# Patient Record
Sex: Male | Born: 1970 | Race: Black or African American | Hispanic: No | Marital: Married | Smoking: Former smoker
Health system: Southern US, Community
[De-identification: ages and names within clinical notes are randomized; demographics above are authoritative.]

## PROBLEM LIST (undated history)

## (undated) DIAGNOSIS — E119 Type 2 diabetes mellitus without complications: Secondary | ICD-10-CM

## (undated) DIAGNOSIS — K859 Acute pancreatitis without necrosis or infection, unspecified: Secondary | ICD-10-CM

## (undated) DIAGNOSIS — I10 Essential (primary) hypertension: Secondary | ICD-10-CM

## (undated) HISTORY — PX: ADRENALECTOMY: SHX876

## (undated) HISTORY — PX: HERNIA REPAIR: SHX51

---

## 2002-10-17 ENCOUNTER — Emergency Department (HOSPITAL_COMMUNITY): Admission: EM | Admit: 2002-10-17 | Discharge: 2002-10-17 | Payer: Self-pay | Admitting: Emergency Medicine

## 2003-02-28 ENCOUNTER — Encounter: Payer: Self-pay | Admitting: Emergency Medicine

## 2003-02-28 ENCOUNTER — Emergency Department (HOSPITAL_COMMUNITY): Admission: AD | Admit: 2003-02-28 | Discharge: 2003-02-28 | Payer: Self-pay | Admitting: Emergency Medicine

## 2003-07-15 ENCOUNTER — Emergency Department (HOSPITAL_COMMUNITY): Admission: AD | Admit: 2003-07-15 | Discharge: 2003-07-15 | Payer: Self-pay | Admitting: Family Medicine

## 2003-09-04 ENCOUNTER — Emergency Department (HOSPITAL_COMMUNITY): Admission: EM | Admit: 2003-09-04 | Discharge: 2003-09-04 | Payer: Self-pay | Admitting: Family Medicine

## 2004-05-23 ENCOUNTER — Emergency Department (HOSPITAL_COMMUNITY): Admission: EM | Admit: 2004-05-23 | Discharge: 2004-05-23 | Payer: Self-pay | Admitting: Family Medicine

## 2004-12-09 ENCOUNTER — Ambulatory Visit (HOSPITAL_COMMUNITY): Admission: EM | Admit: 2004-12-09 | Discharge: 2004-12-09 | Payer: Self-pay | Admitting: Emergency Medicine

## 2005-03-07 ENCOUNTER — Emergency Department (HOSPITAL_COMMUNITY): Admission: EM | Admit: 2005-03-07 | Discharge: 2005-03-07 | Payer: Self-pay | Admitting: Emergency Medicine

## 2005-04-26 ENCOUNTER — Emergency Department (HOSPITAL_COMMUNITY): Admission: EM | Admit: 2005-04-26 | Discharge: 2005-04-26 | Payer: Self-pay | Admitting: Emergency Medicine

## 2005-05-15 ENCOUNTER — Emergency Department (HOSPITAL_COMMUNITY): Admission: EM | Admit: 2005-05-15 | Discharge: 2005-05-15 | Payer: Self-pay | Admitting: Emergency Medicine

## 2005-05-15 ENCOUNTER — Emergency Department (HOSPITAL_COMMUNITY): Admission: EM | Admit: 2005-05-15 | Discharge: 2005-05-15 | Payer: Self-pay | Admitting: Family Medicine

## 2016-01-16 ENCOUNTER — Encounter (HOSPITAL_BASED_OUTPATIENT_CLINIC_OR_DEPARTMENT_OTHER): Payer: Self-pay | Admitting: Emergency Medicine

## 2016-01-16 ENCOUNTER — Emergency Department (HOSPITAL_BASED_OUTPATIENT_CLINIC_OR_DEPARTMENT_OTHER)
Admission: EM | Admit: 2016-01-16 | Discharge: 2016-01-16 | Disposition: A | Discharge status: Home / Self Care | Payer: Self-pay | Attending: Emergency Medicine | Admitting: Emergency Medicine

## 2016-01-16 DIAGNOSIS — E119 Type 2 diabetes mellitus without complications: Secondary | ICD-10-CM | POA: Insufficient documentation

## 2016-01-16 DIAGNOSIS — Y929 Unspecified place or not applicable: Secondary | ICD-10-CM | POA: Insufficient documentation

## 2016-01-16 DIAGNOSIS — X500XXA Overexertion from strenuous movement or load, initial encounter: Secondary | ICD-10-CM | POA: Insufficient documentation

## 2016-01-16 DIAGNOSIS — Z7984 Long term (current) use of oral hypoglycemic drugs: Secondary | ICD-10-CM | POA: Insufficient documentation

## 2016-01-16 DIAGNOSIS — M545 Low back pain: Secondary | ICD-10-CM | POA: Insufficient documentation

## 2016-01-16 DIAGNOSIS — Y93F2 Activity, caregiving, lifting: Secondary | ICD-10-CM | POA: Insufficient documentation

## 2016-01-16 DIAGNOSIS — Y990 Civilian activity done for income or pay: Secondary | ICD-10-CM | POA: Insufficient documentation

## 2016-01-16 DIAGNOSIS — M549 Dorsalgia, unspecified: Secondary | ICD-10-CM

## 2016-01-16 HISTORY — DX: Type 2 diabetes mellitus without complications: E11.9

## 2016-01-16 MED ORDER — HYDROCODONE-ACETAMINOPHEN 5-325 MG PO TABS: 1.0000 | ORAL_TABLET | Freq: Four times a day (QID) | ORAL | Status: DC | PRN

## 2016-01-16 NOTE — ED Provider Notes (Addendum)
CSN: [id number]     Arrival date & time 01/16/16  0303 History   First MD Initiated Contact with Patient 01/16/16 [phone]     Chief Complaint  Patient presents with  . Back Injury     (Consider location/radiation/quality/duration/timing/severity/associated sxs/prior Treatment) HPI  This is a 45 year old male who was lifting a tub at work yesterday and injured his back. Specifically he is injured in the right posterolateral rib cage. He did not feel a pop or anything that suggested a broken rib. The onset of pain was gradual. It is now moderate and worse with twisting. He is not having difficulty breathing. He has taken Aleve without adequate relief.  Past Medical History  Diagnosis Date  . Diabetes mellitus without complication (HCC)    History reviewed. No pertinent past surgical history. History reviewed. No pertinent family history. Social History  Substance Use Topics  . Smoking status: Never Smoker   . Smokeless tobacco: None  . Alcohol Use: No    Review of Systems  All other systems reviewed and are negative.   Allergies  Penicillins  Home Medications   Prior to Admission medications   Medication Sig Start Date End Date Taking? Authorizing Provider  metFORMIN (GLUCOPHAGE) 1000 MG tablet Take 3 mg by mouth 2 (two) times daily with a meal.   Yes Historical Provider, MD   BP 140/106 mmHg  Pulse 78  Temp(Src) 97.9 F (36.6 C) (Oral)  Resp 20  Ht 5\' 10"  (1.778 m)  Wt 195 lb (88.451 kg)  BMI 27.98 kg/m2  SpO2 100%   Physical Exam  General: Well-developed, well-nourished male in no acute distress; appearance consistent with age of record HENT: normocephalic; atraumatic Eyes: Normal appearance Neck: supple Heart: regular rate and rhythm Lungs: clear to auscultation bilaterally Abdomen: soft; nondistended Back: Mild tenderness over right posterolateral lower rib cage without point tenderness or crepitus Extremities: No deformity; full range of motion Neurologic:  Awake, alert and oriented; motor function intact in all extremities and symmetric; no facial droop Skin: Warm and dry Psychiatric: Normal mood and affect    ED Course  Procedures (including critical care time)   MDM      Paula Libra, MD 01/16/16 0421  Paula Libra, MD 01/16/16 2423

## 2016-01-16 NOTE — ED Notes (Signed)
Patient states that he lifted a box or tub yesterday and now has pain to his right lower back

## 2016-06-01 ENCOUNTER — Emergency Department (HOSPITAL_BASED_OUTPATIENT_CLINIC_OR_DEPARTMENT_OTHER)
Admission: EM | Admit: 2016-06-01 | Discharge: 2016-06-01 | Disposition: A | Discharge status: Home / Self Care | Payer: Worker's Compensation | Attending: Emergency Medicine | Admitting: Emergency Medicine

## 2016-06-01 ENCOUNTER — Encounter (HOSPITAL_BASED_OUTPATIENT_CLINIC_OR_DEPARTMENT_OTHER): Payer: Self-pay

## 2016-06-01 ENCOUNTER — Emergency Department (HOSPITAL_BASED_OUTPATIENT_CLINIC_OR_DEPARTMENT_OTHER): Payer: Worker's Compensation

## 2016-06-01 DIAGNOSIS — J189 Pneumonia, unspecified organism: Secondary | ICD-10-CM

## 2016-06-01 DIAGNOSIS — I10 Essential (primary) hypertension: Secondary | ICD-10-CM | POA: Insufficient documentation

## 2016-06-01 DIAGNOSIS — Z7984 Long term (current) use of oral hypoglycemic drugs: Secondary | ICD-10-CM | POA: Insufficient documentation

## 2016-06-01 DIAGNOSIS — E119 Type 2 diabetes mellitus without complications: Secondary | ICD-10-CM | POA: Insufficient documentation

## 2016-06-01 DIAGNOSIS — R55 Syncope and collapse: Secondary | ICD-10-CM | POA: Insufficient documentation

## 2016-06-01 DIAGNOSIS — Z79899 Other long term (current) drug therapy: Secondary | ICD-10-CM | POA: Insufficient documentation

## 2016-06-01 HISTORY — DX: Essential (primary) hypertension: I10

## 2016-06-01 LAB — CBC WITH DIFFERENTIAL/PLATELET
BASOS ABS: 0 10*3/uL (ref 0.0–0.1)
Basophils Relative: 0 %
Eosinophils Absolute: 0.1 10*3/uL (ref 0.0–0.7)
Eosinophils Relative: 2 %
HEMATOCRIT: 43.6 % (ref 39.0–52.0)
Hemoglobin: 14.3 g/dL (ref 13.0–17.0)
LYMPHS PCT: 38 %
Lymphs Abs: 2.5 10*3/uL (ref 0.7–4.0)
MCH: 27 pg (ref 26.0–34.0)
MCHC: 32.8 g/dL (ref 30.0–36.0)
MCV: 82.3 fL (ref 78.0–100.0)
MONO ABS: 0.4 10*3/uL (ref 0.1–1.0)
Monocytes Relative: 7 %
NEUTROS ABS: 3.5 10*3/uL (ref 1.7–7.7)
Neutrophils Relative %: 53 %
Platelets: 207 10*3/uL (ref 150–400)
RBC: 5.3 MIL/uL (ref 4.22–5.81)
RDW: 14.3 % (ref 11.5–15.5)
WBC: 6.6 10*3/uL (ref 4.0–10.5)

## 2016-06-01 LAB — COMPREHENSIVE METABOLIC PANEL
ALK PHOS: 77 U/L (ref 38–126)
ALT: 13 U/L — AB (ref 17–63)
AST: 14 U/L — AB (ref 15–41)
Albumin: 4 g/dL (ref 3.5–5.0)
Anion gap: 6 (ref 5–15)
BILIRUBIN TOTAL: 0.9 mg/dL (ref 0.3–1.2)
BUN: 15 mg/dL (ref 6–20)
CALCIUM: 8.5 mg/dL — AB (ref 8.9–10.3)
CO2: 24 mmol/L (ref 22–32)
CREATININE: 0.88 mg/dL (ref 0.61–1.24)
Chloride: 110 mmol/L (ref 101–111)
GFR calc Af Amer: >60 mL/min (ref >60)
Glucose, Bld: 154 mg/dL — ABNORMAL HIGH (ref 65–99)
Potassium: 3.6 mmol/L (ref 3.5–5.1)
Sodium: 140 mmol/L (ref 135–145)
TOTAL PROTEIN: 7 g/dL (ref 6.5–8.1)

## 2016-06-01 LAB — CBG MONITORING, ED: GLUCOSE-CAPILLARY: 143 mg/dL — AB (ref 65–99)

## 2016-06-01 LAB — LIPASE, BLOOD: LIPASE: 34 U/L (ref 11–51)

## 2016-06-01 MED ORDER — AZITHROMYCIN 250 MG PO TABS: 250.0000 mg | ORAL_TABLET | Freq: Every day | ORAL | 0 refills | Status: DC

## 2016-06-01 MED ORDER — SODIUM CHLORIDE 0.9 % IV BOLUS (SEPSIS)
1000.0000 mL | Freq: Once | INTRAVENOUS | Status: AC
  Administered 2016-06-01: 1000 mL via INTRAVENOUS

## 2016-06-01 MED ORDER — AZITHROMYCIN 250 MG PO TABS
500.0000 mg | ORAL_TABLET | Freq: Once | ORAL | Status: AC
  Administered 2016-06-01: 500 mg via ORAL
  Filled 2016-06-01: qty 2

## 2016-06-01 NOTE — ED Triage Notes (Signed)
Pt states he woke up and did not feel well, states he felt light headed at that time.  Pt also c/o bilateral upper abdominal pain.  Pt states that when he got to work the light headed feeling increased and he felt like he was going to pass out, states he leaned against the wall and sat down on the ground.  Pt does not feel he passed out.  Pt also complains that since this morning with the lightheaded feeling, he feels like he is "slurring" his words.  Pt's speech is clear, denies headache, denies chest pain, grip strength is equal, no extremity drift, no facial asymmetry, no neuro deficits noted on exam.

## 2016-06-01 NOTE — ED Notes (Signed)
Pt verbalizes understanding of d/c instructions and denies any further needs at this time. 

## 2016-06-01 NOTE — ED Notes (Signed)
Pt has workers comp paperwork with him and requires a UDS and BAT as they are calling this a post accident screening.

## 2016-06-01 NOTE — ED Provider Notes (Signed)
[facility]   CSN: [id number] Arrival date & time: 06/01/16  0227     History   Chief Complaint Chief Complaint  Patient presents with  . Near Syncope    HPI Benjamin MarshallCharles E Hallahan is a 45 y.o. male.With a past medical history of hypertension diabetes presenting today with presyncope. Patient states he woke up this morning with abdominal pain is midepigastric area. He did not think anything of it. He had nausea but no vomiting. He denies diarrhea. Subsequently when he went to work tonight he had presyncopal episode and felt like she was going to pass out. He states his abdominal pain is now gone. He does not take any medication for this throughout the day. He states he ate his normal appetite. He denies any dehydration or recent illness. No further complaints.  10 Systems reviewed and are negative for acute change except as noted in the HPI.    HPI  Past Medical History:  Diagnosis Date  . Diabetes mellitus without complication (HCC)   . Hypertension     There are no active problems to display for this patient.   History reviewed. No pertinent surgical history.     Home Medications    Prior to Admission medications   Medication Sig Start Date End Date Taking? Authorizing Provider  azithromycin (ZITHROMAX) 250 MG tablet Take 1 tablet (250 mg total) by mouth daily. 06/01/16   Tomasita CrumbleAdeleke Loni Abdon, MD  HYDROcodone-acetaminophen (NORCO) 5-325 MG tablet Take 1-2 tablets by mouth every 6 (six) hours as needed (for pain). 01/16/16   John Molpus, MD  metFORMIN (GLUCOPHAGE) 1000 MG tablet Take 3 mg by mouth 2 (two) times daily with a meal.    Historical Provider, MD    Family History No family history on file.  Social History Social History  Substance Use Topics  . Smoking status: Never Smoker  . Smokeless tobacco: Not on file  . Alcohol use No     Allergies   Penicillins   Review of Systems Review of Systems   Physical Exam Updated Vital Signs BP  (!) 144/105 (BP Location: Left Arm)   Pulse 80   Temp 98.3 F (36.8 C) (Oral)   Resp 20   Ht 5\' 10"  (1.778 m)   Wt 199 lb (90.3 kg)   SpO2 97%   BMI 28.55 kg/m   Physical Exam  Constitutional: He is oriented to person, place, and time. Vital signs are normal. He appears well-developed and well-nourished.  Non-toxic appearance. He does not appear ill. No distress.  HENT:  Head: Normocephalic and atraumatic.  Nose: Nose normal.  Mouth/Throat: Oropharynx is clear and moist. No oropharyngeal exudate.  Eyes: Conjunctivae and EOM are normal. Pupils are equal, round, and reactive to light. No scleral icterus.  Neck: Normal range of motion. Neck supple. No tracheal deviation, no edema, no erythema and normal range of motion present. No thyroid mass and no thyromegaly present.  Cardiovascular: Normal rate, regular rhythm, S1 normal, S2 normal, normal heart sounds, intact distal pulses and normal pulses.  Exam reveals no gallop and no friction rub.   No murmur heard. Pulmonary/Chest: Effort normal and breath sounds normal. No respiratory distress. He has no wheezes. He has no rhonchi. He has no rales.  Abdominal: Soft. Normal appearance and bowel sounds are normal. He exhibits no distension, no ascites and no mass. There is no hepatosplenomegaly. There is no tenderness. There is no rebound, no guarding and no CVA tenderness.  Musculoskeletal: Normal range  of motion. He exhibits no edema or tenderness.  Lymphadenopathy:    He has no cervical adenopathy.  Neurological: He is alert and oriented to person, place, and time. He has normal strength. No cranial nerve deficit or sensory deficit.  Normal strength and sensation in all extremity. Normal cerebellar testing.  Skin: Skin is warm, dry and intact. No petechiae and no rash noted. He is not diaphoretic. No erythema. No pallor.  Nursing note and vitals reviewed.    ED Treatments / Results  Labs (all labs ordered are listed, but only abnormal  results are displayed) Labs Reviewed  COMPREHENSIVE METABOLIC PANEL - Abnormal; Notable for the following:       Result Value   Glucose, Bld 154 (*)    Calcium 8.5 (*)    AST 14 (*)    ALT 13 (*)    All other components within normal limits  CBG MONITORING, ED - Abnormal; Notable for the following:    Glucose-Capillary 143 (*)    All other components within normal limits  CBC WITH DIFFERENTIAL/PLATELET  LIPASE, BLOOD    EKG  EKG Interpretation  Date/Time:  Wednesday June 01 2016 02:40:04 EST Ventricular Rate:  81 PR Interval:    QRS Duration: 88 QT Interval:  370 QTC Calculation: 430 R Axis:   38 Text Interpretation:  Sinus rhythm No significant change since last tracing Confirmed by Erroll Lunani, Gurtej Noyola Ayokunle [phone]([id number]) on 06/01/2016 3:18:55 AM       Radiology Dg Chest 2 View  Result Date: 06/01/2016 CLINICAL DATA:  Feeling unwell, lightheadedness and upper abdominal pain. EXAM: CHEST  2 VIEW COMPARISON:  None. FINDINGS: Cardiomediastinal silhouette is normal. No pleural effusions or focal consolidations. Bibasilar interstitial prominence. Trachea projects midline and there is no pneumothorax. Soft tissue planes and included osseous structures are non-suspicious. IMPRESSION: Bibasilar interstitial prominence could represent atelectasis though, pneumonia not excluded. Electronically Signed   By: Awilda Metroourtnay  Bloomer M.D.   On: 06/01/2016 04:09    Procedures Procedures (including critical care time)  Medications Ordered in ED Medications  azithromycin (ZITHROMAX) tablet 500 mg (not administered)  sodium chloride 0.9 % bolus 1,000 mL (1,000 mLs Intravenous New Bag/Given 06/01/16 0407)     Initial Impression / Assessment and Plan / ED Course  I have reviewed the triage vital signs and the nursing notes.  Pertinent labs & imaging results that were available during my care of the patient were reviewed by me and considered in my medical decision making (see chart for  details).  Clinical Course     Patient presents to emergency department for presyncope. Will obtain upper studies, give IV fluids, and observed. EKG does not show any cause for possible syncope. Chest x-ray is also pending.  4:46 AM labs normal, but cxr reveals possible pna.  Could be cause of abd pain and presyncope.  Will treat with 5 days azitho.  First dose given in th ED.  Final Clinical Impressions(s) / ED Diagnoses   Final diagnoses:  Near syncope  Community acquired pneumonia, unspecified laterality    New Prescriptions New Prescriptions   AZITHROMYCIN (ZITHROMAX) 250 MG TABLET    Take 1 tablet (250 mg total) by mouth daily.     Tomasita CrumbleAdeleke Ralph Brouwer, MD 06/01/16 [phone]

## 2017-12-30 IMAGING — DX DG CHEST 2V
2 series · 2 of 2 positions shown · non-contrast
Comparison: None.

CLINICAL DATA: Feeling unwell, lightheadedness and upper abdominal
pain.

EXAM:
CHEST  2 VIEW

[chest pa]
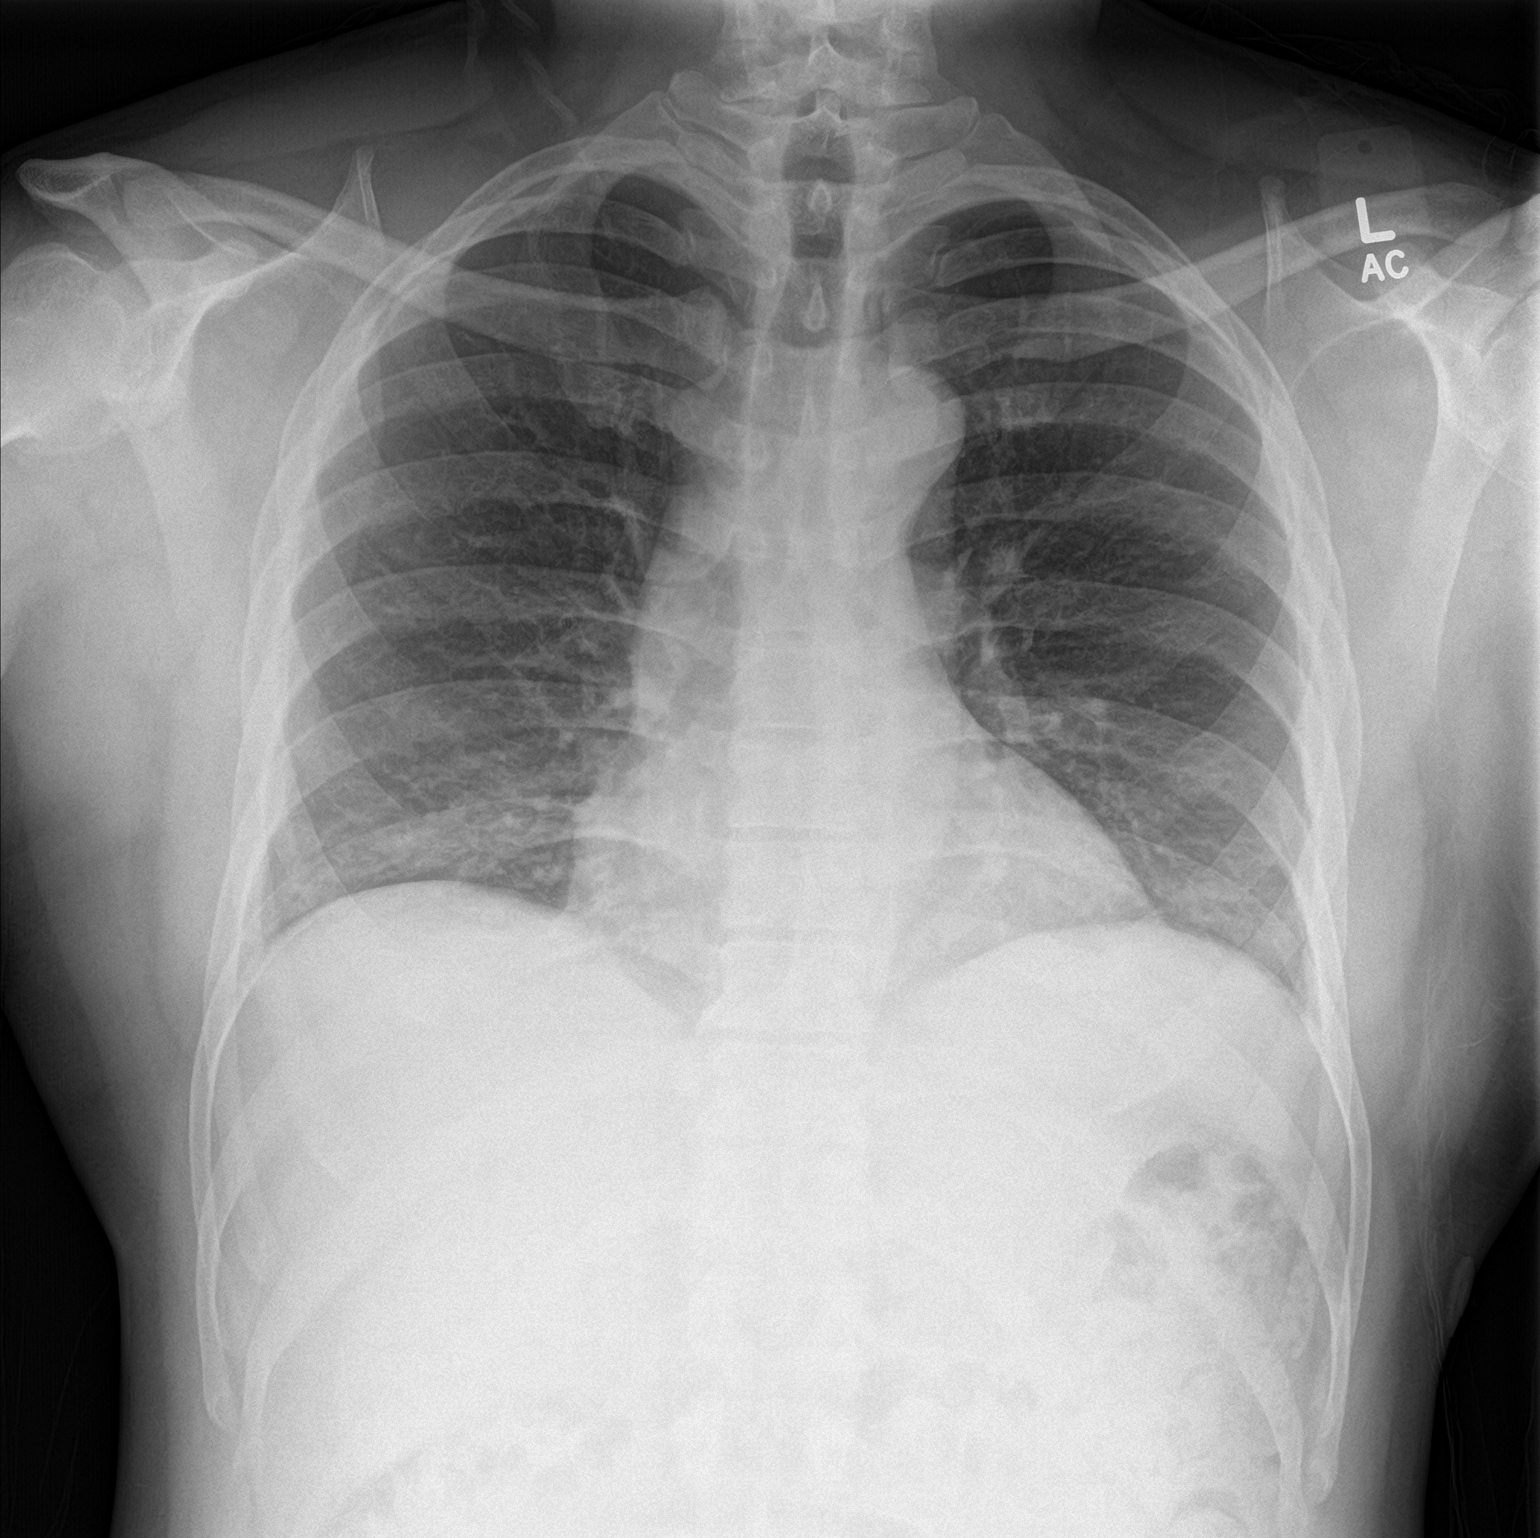

[chest lat]
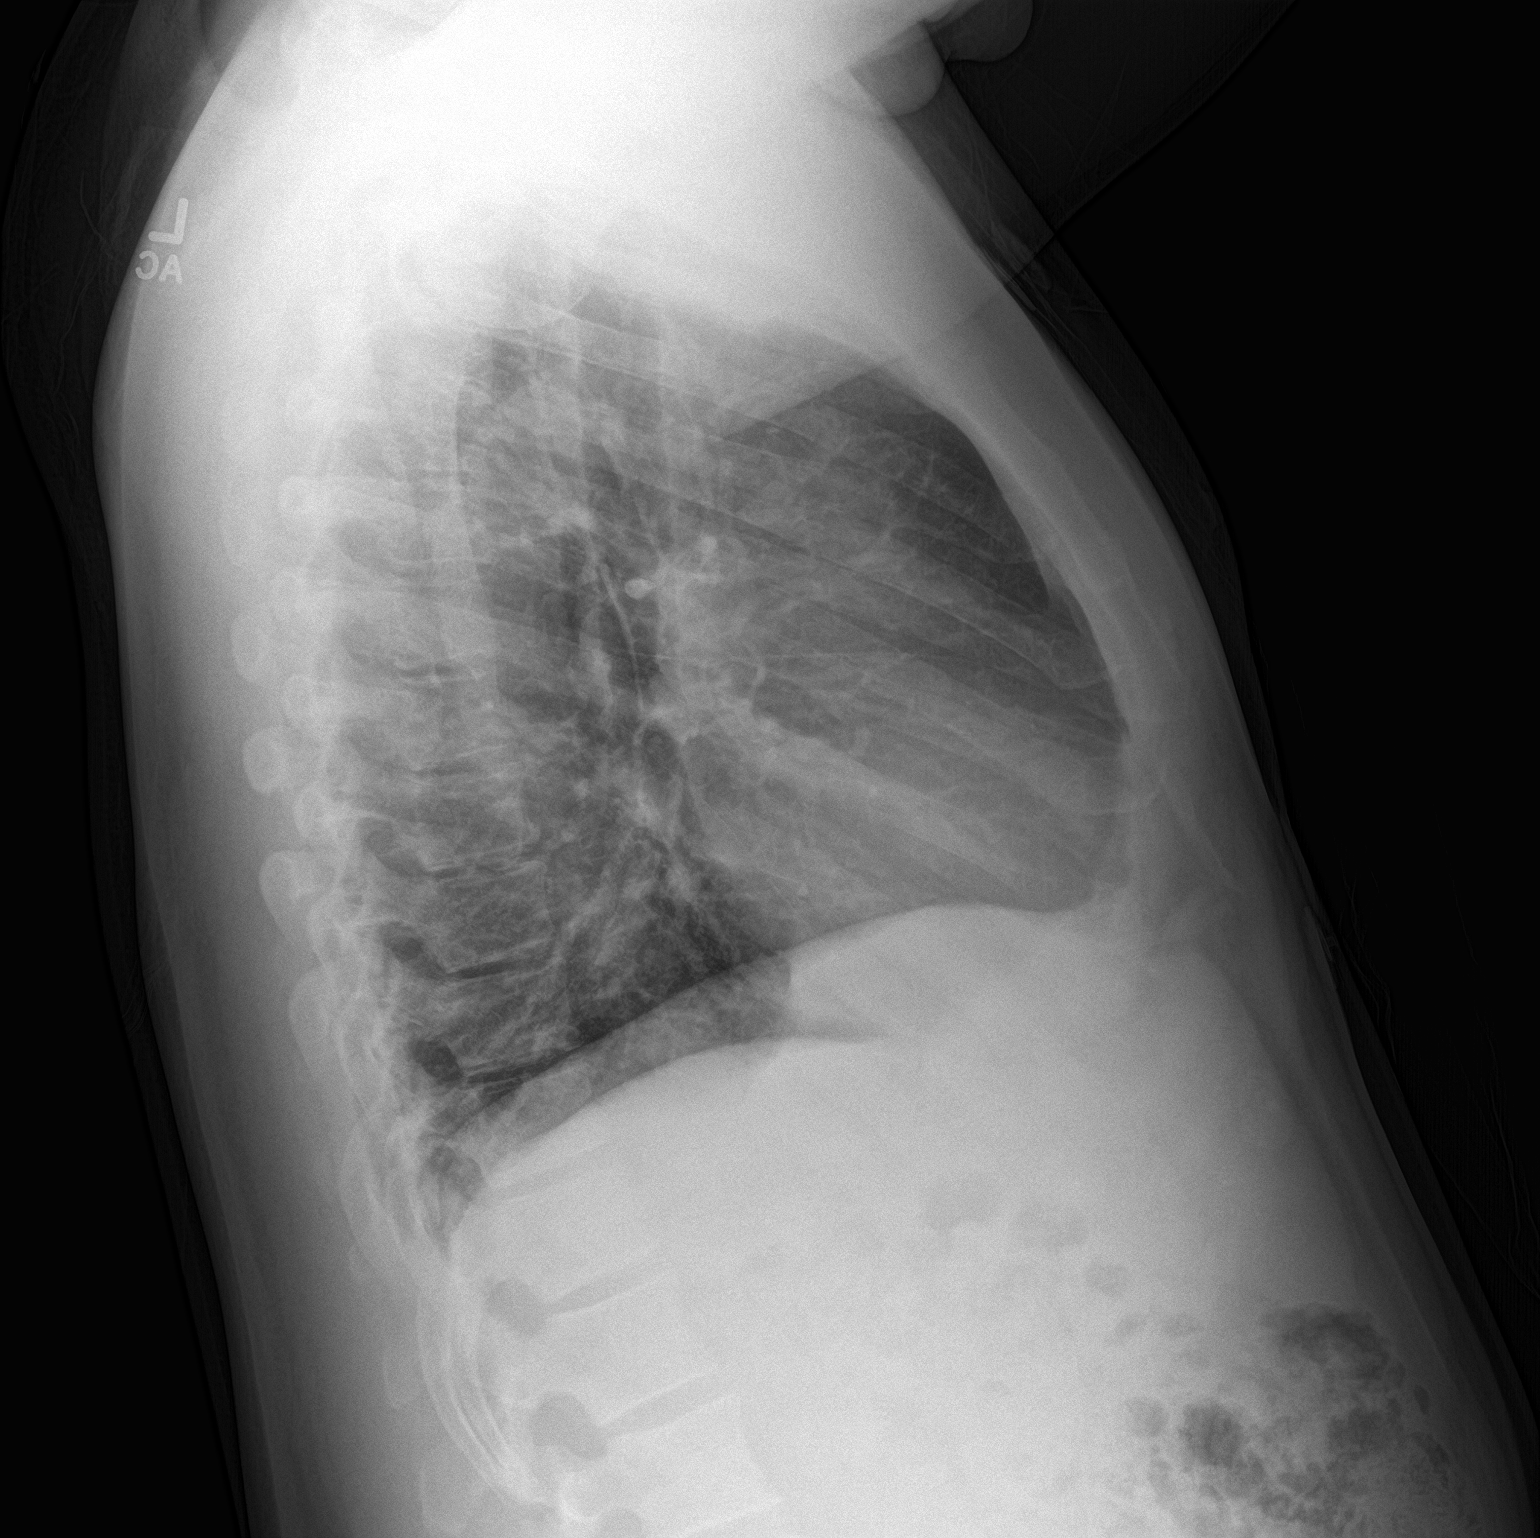

[2 of 2 positions shown; findings below may reference images not displayed]

FINDINGS: Cardiomediastinal silhouette is normal. No pleural effusions or
focal consolidations. Bibasilar interstitial prominence. Trachea
projects midline and there is no pneumothorax. Soft tissue planes
and included osseous structures are non-suspicious.
IMPRESSION: Bibasilar interstitial prominence could represent atelectasis
though, pneumonia not excluded.

## 2018-05-04 ENCOUNTER — Emergency Department (HOSPITAL_BASED_OUTPATIENT_CLINIC_OR_DEPARTMENT_OTHER)
Admission: EM | Admit: 2018-05-04 | Discharge: 2018-05-04 | Disposition: A | Discharge status: Home / Self Care | Payer: BLUE CROSS/BLUE SHIELD | Attending: Emergency Medicine | Admitting: Emergency Medicine

## 2018-05-04 ENCOUNTER — Encounter (HOSPITAL_BASED_OUTPATIENT_CLINIC_OR_DEPARTMENT_OTHER): Payer: Self-pay | Admitting: *Deleted

## 2018-05-04 ENCOUNTER — Other Ambulatory Visit: Payer: Self-pay

## 2018-05-04 DIAGNOSIS — R739 Hyperglycemia, unspecified: Secondary | ICD-10-CM

## 2018-05-04 DIAGNOSIS — E119 Type 2 diabetes mellitus without complications: Secondary | ICD-10-CM

## 2018-05-04 DIAGNOSIS — I10 Essential (primary) hypertension: Secondary | ICD-10-CM | POA: Insufficient documentation

## 2018-05-04 DIAGNOSIS — F17210 Nicotine dependence, cigarettes, uncomplicated: Secondary | ICD-10-CM | POA: Insufficient documentation

## 2018-05-04 DIAGNOSIS — Z7984 Long term (current) use of oral hypoglycemic drugs: Secondary | ICD-10-CM | POA: Insufficient documentation

## 2018-05-04 DIAGNOSIS — N289 Disorder of kidney and ureter, unspecified: Secondary | ICD-10-CM | POA: Diagnosis not present

## 2018-05-04 DIAGNOSIS — R350 Frequency of micturition: Secondary | ICD-10-CM | POA: Diagnosis present

## 2018-05-04 DIAGNOSIS — E1165 Type 2 diabetes mellitus with hyperglycemia: Secondary | ICD-10-CM | POA: Insufficient documentation

## 2018-05-04 LAB — URINALYSIS, ROUTINE W REFLEX MICROSCOPIC
BILIRUBIN URINE: NEGATIVE
Glucose, UA: 250 mg/dL — AB
Ketones, ur: NEGATIVE mg/dL
Leukocytes, UA: NEGATIVE
Nitrite: NEGATIVE
PH: 6 (ref 5.0–8.0)
Protein, ur: 100 mg/dL — AB

## 2018-05-04 LAB — BASIC METABOLIC PANEL
Anion gap: 9 (ref 5–15)
BUN: 24 mg/dL — AB (ref 6–20)
CO2: 23 mmol/L (ref 22–32)
Calcium: 9.1 mg/dL (ref 8.9–10.3)
Chloride: 104 mmol/L (ref 98–111)
Creatinine, Ser: 1.35 mg/dL — ABNORMAL HIGH (ref 0.61–1.24)
GFR calc Af Amer: >60 mL/min (ref >60)
GFR calc non Af Amer: >60 mL/min (ref >60)
GLUCOSE: 165 mg/dL — AB (ref 70–99)
POTASSIUM: 3.6 mmol/L (ref 3.5–5.1)
Sodium: 136 mmol/L (ref 135–145)

## 2018-05-04 LAB — URINALYSIS, MICROSCOPIC (REFLEX): WBC, UA: NONE SEEN WBC/hpf (ref 0–5)

## 2018-05-04 LAB — CBG MONITORING, ED: Glucose-Capillary: 171 mg/dL — ABNORMAL HIGH (ref 70–99)

## 2018-05-04 MED ORDER — GLIPIZIDE 5 MG PO TABS: 2.5000 mg | ORAL_TABLET | Freq: Every day | ORAL | 0 refills | Status: AC

## 2018-05-04 NOTE — ED Triage Notes (Signed)
Pt c/o increased urination  X 3 weeks.

## 2018-05-04 NOTE — ED Provider Notes (Signed)
[facility] EMERGENCY DEPARTMENT Provider Note   CSN: [id number] Arrival date & time: 05/04/18  1730     History   Chief Complaint Chief Complaint  Patient presents with  . Urinary Frequency    HPI Benjamin Branch is a 47 y.o. male.  HPI Patient has a history of diabetes.  Has been on metformin but is been taking only somewhat sporadically.  States he gets some stomach problems.  Has been having more frequency at night but not been sleeping well.  Has to get up to urinate both frequently and with a large amount.  No abdominal pain.  No lightheadedness dizziness.  No chest pain.  No trouble breathing.  He has had some mild weight loss. Also has had calf pain.  Left Achilles area.  States there is a swelling that has been there for years but now more painful. Past Medical History:  Diagnosis Date  . Diabetes mellitus without complication (HCC)   . Hypertension     There are no active problems to display for this patient.   History reviewed. No pertinent surgical history.      Home Medications    Prior to Admission medications   Medication Sig Start Date End Date Taking? Authorizing Provider  glipiZIDE (GLUCOTROL) 5 MG tablet Take 0.5 tablets (2.5 mg total) by mouth daily before breakfast. 05/04/18   [name], MD  metFORMIN (GLUCOPHAGE) 1000 MG tablet Take 3 mg by mouth 2 (two) times daily with a meal.    [provider]    Family History No family history on file.  Social History Social History   Tobacco Use  . Smoking status: Current Every Day Smoker    Types: Cigarettes  Substance Use Topics  . Alcohol use: No  . Drug use: No     Allergies   Penicillins   Review of Systems Review of Systems  Constitutional: Negative for appetite change.  HENT: Negative for congestion.   Respiratory: Negative for shortness of breath.   Cardiovascular: Negative for chest pain.  Endocrine: Negative for polydipsia, polyphagia and polyuria.    Genitourinary: Positive for frequency.  Musculoskeletal: Negative for back pain.       Left Achilles pain.  Skin: Negative for rash.  Neurological: Negative for weakness.     Physical Exam Updated Vital Signs BP (!) 142/107 (BP Location: Right Arm)   Pulse 78   Temp 98.6 F (37 C) (Oral)   Resp 16   Ht 5\' 10"  (1.778 m)   Wt 90.7 kg   SpO2 99%   BMI 28.70 kg/m   Physical Exam  Constitutional: He appears well-developed.  HENT:  Head: Atraumatic.  Eyes: Pupils are equal, round, and reactive to light.  Neck: Neck supple.  Cardiovascular: Normal rate.  Pulmonary/Chest: Effort normal.  Abdominal: There is no tenderness.  Musculoskeletal: He exhibits tenderness.  Over the left Achilles there is a somewhat tender swelling.  Good flexion and extension at the ankle.  States the swelling is been there for a while.  No erythema.  Neurological: He is alert.  Skin: Skin is warm.     ED Treatments / Results  Labs (all labs ordered are listed, but only abnormal results are displayed) Labs Reviewed  URINALYSIS, ROUTINE W REFLEX MICROSCOPIC - Abnormal; Notable for the following components:      Result Value   Specific Gravity, Urine >1.030 (*)    Glucose, UA 250 (*)    Hgb urine dipstick SMALL (*)  Protein, ur 100 (*)    All other components within normal limits  BASIC METABOLIC PANEL - Abnormal; Notable for the following components:   Glucose, Bld 165 (*)    BUN 24 (*)    Creatinine, Ser 1.35 (*)    All other components within normal limits  URINALYSIS, MICROSCOPIC (REFLEX) - Abnormal; Notable for the following components:   Bacteria, UA RARE (*)    All other components within normal limits  CBG MONITORING, ED - Abnormal; Notable for the following components:   Glucose-Capillary 171 (*)    All other components within normal limits    EKG None  Radiology No results found.  Procedures Procedures (including critical care time)  Medications Ordered in  ED Medications - No data to display   Initial Impression / Assessment and Plan / ED Course  I have reviewed the triage vital signs and the nursing notes.  Pertinent labs & imaging results that were available during my care of the patient were reviewed by me and considered in my medical decision making (see chart for details).     Patient with hyperglycemia.  Sounds like he has poorly controlled diabetes.  Has not been taking his metformin consistently.  Has mild renal insufficiency.  Will add glipizide to help for glucose control.  Will need outpatient PCP follow-up.  Also left calf pain over the Achilles.  Will have follow-up with sports medicine.  Final Clinical Impressions(s) / ED Diagnoses   Final diagnoses:  Hyperglycemia  Diabetes mellitus treated with oral medication (HCC)  Renal insufficiency    ED Discharge Orders         Ordered    glipiZIDE (GLUCOTROL) 5 MG tablet  Daily before breakfast     05/04/18 1853           [name], MD 05/04/18 1934

## 2018-05-04 NOTE — Discharge Instructions (Signed)
Follow-up with the primary care doctor.  Monitor your sugars at home.

## 2018-05-04 NOTE — ED Notes (Signed)
CBG 174  

## 2018-06-26 ENCOUNTER — Other Ambulatory Visit: Payer: Self-pay | Admitting: Family Medicine

## 2018-06-26 DIAGNOSIS — N5089 Other specified disorders of the male genital organs: Secondary | ICD-10-CM

## 2018-06-29 ENCOUNTER — Encounter (HOSPITAL_BASED_OUTPATIENT_CLINIC_OR_DEPARTMENT_OTHER): Payer: Self-pay

## 2018-06-29 ENCOUNTER — Emergency Department (HOSPITAL_BASED_OUTPATIENT_CLINIC_OR_DEPARTMENT_OTHER)
Admission: EM | Admit: 2018-06-29 | Discharge: 2018-06-29 | Disposition: A | Discharge status: Home / Self Care | Payer: BLUE CROSS/BLUE SHIELD | Attending: Emergency Medicine | Admitting: Emergency Medicine

## 2018-06-29 ENCOUNTER — Other Ambulatory Visit: Payer: Self-pay

## 2018-06-29 DIAGNOSIS — F17200 Nicotine dependence, unspecified, uncomplicated: Secondary | ICD-10-CM | POA: Diagnosis not present

## 2018-06-29 DIAGNOSIS — I10 Essential (primary) hypertension: Secondary | ICD-10-CM | POA: Diagnosis not present

## 2018-06-29 DIAGNOSIS — E119 Type 2 diabetes mellitus without complications: Secondary | ICD-10-CM | POA: Insufficient documentation

## 2018-06-29 DIAGNOSIS — R1013 Epigastric pain: Secondary | ICD-10-CM | POA: Diagnosis not present

## 2018-06-29 LAB — CBC WITH DIFFERENTIAL/PLATELET
ABS IMMATURE GRANULOCYTES: 0.02 10*3/uL (ref 0.00–0.07)
BASOS ABS: 0.1 10*3/uL (ref 0.0–0.1)
Basophils Relative: 1 %
Eosinophils Absolute: 0.1 10*3/uL (ref 0.0–0.5)
Eosinophils Relative: 1 %
HEMATOCRIT: 44.6 % (ref 39.0–52.0)
HEMOGLOBIN: 14 g/dL (ref 13.0–17.0)
IMMATURE GRANULOCYTES: 0 %
LYMPHS ABS: 3.6 10*3/uL (ref 0.7–4.0)
LYMPHS PCT: 41 %
MCH: 26.7 pg (ref 26.0–34.0)
MCHC: 31.4 g/dL (ref 30.0–36.0)
MCV: 85.1 fL (ref 80.0–100.0)
MONOS PCT: 7 %
Monocytes Absolute: 0.6 10*3/uL (ref 0.1–1.0)
NEUTROS PCT: 50 %
NRBC: 0 % (ref 0.0–0.2)
Neutro Abs: 4.4 10*3/uL (ref 1.7–7.7)
Platelets: 238 10*3/uL (ref 150–400)
RBC: 5.24 MIL/uL (ref 4.22–5.81)
RDW: 13.6 % (ref 11.5–15.5)
WBC: 8.8 10*3/uL (ref 4.0–10.5)

## 2018-06-29 LAB — COMPREHENSIVE METABOLIC PANEL
ALBUMIN: 4.3 g/dL (ref 3.5–5.0)
ALK PHOS: 65 U/L (ref 38–126)
ALT: 14 U/L (ref 0–44)
AST: 21 U/L (ref 15–41)
Anion gap: 8 (ref 5–15)
BILIRUBIN TOTAL: 0.7 mg/dL (ref 0.3–1.2)
BUN: 17 mg/dL (ref 6–20)
CALCIUM: 8.9 mg/dL (ref 8.9–10.3)
CO2: 23 mmol/L (ref 22–32)
CREATININE: 1.31 mg/dL — AB (ref 0.61–1.24)
Chloride: 107 mmol/L (ref 98–111)
GFR calc Af Amer: >60 mL/min (ref >60)
GLUCOSE: 206 mg/dL — AB (ref 70–99)
POTASSIUM: 3.5 mmol/L (ref 3.5–5.1)
Sodium: 138 mmol/L (ref 135–145)
TOTAL PROTEIN: 7.6 g/dL (ref 6.5–8.1)

## 2018-06-29 LAB — CBG MONITORING, ED: Glucose-Capillary: 184 mg/dL — ABNORMAL HIGH (ref 70–99)

## 2018-06-29 LAB — TROPONIN I: Troponin I: <0.03 ng/mL (ref <0.03)

## 2018-06-29 LAB — LIPASE, BLOOD: Lipase: 85 U/L — ABNORMAL HIGH (ref 11–51)

## 2018-06-29 MED ORDER — SUCRALFATE 1 G PO TABS: 1.0000 g | ORAL_TABLET | Freq: Four times a day (QID) | ORAL | 0 refills | Status: AC | PRN

## 2018-06-29 MED ORDER — ONDANSETRON 4 MG PO TBDP: 4.0000 mg | ORAL_TABLET | Freq: Three times a day (TID) | ORAL | 0 refills | Status: DC | PRN

## 2018-06-29 NOTE — ED Triage Notes (Signed)
Upper abdominal pain for over a week, saw his PCP on Tuesday and was given GI cocktail, states it is not helping, denies n/v/d, c/o subjective fever

## 2018-06-29 NOTE — ED Provider Notes (Signed)
[facility] Emergency Department Provider Note MRN:  [id number]  Arrival date & time: 06/29/18     Chief Complaint   Abdominal Pain   History of Present Illness   Benjamin Branch is a 48 y.o. year-old male with a history of hypertension, diabetes presenting to the ED with chief complaint of abdominal pain.  The pain is located in the epigastrium, nonradiating, described as a dull pain, has been present for 2 to 3 days, constant but comes in waves.  Worse with lying flat, possibly worse after meals.  Denies dizziness, no diaphoresis, no shortness of breath, no nausea, no vomiting.  No lower abdominal pain.  Had a similar episode 5 to 6 years ago, states he was diagnosed with a mild case of pancreatitis.  Review of Systems  A complete 10 system review of systems was obtained and all systems are negative except as noted in the HPI and PMH.   Patient's Health History    Past Medical History:  Diagnosis Date  . Diabetes mellitus without complication (HCC)   . Hypertension     Past Surgical History:  Procedure Laterality Date  . HERNIA REPAIR      No family history on file.  Social History   Socioeconomic History  . Marital status: Married    Spouse name: Not on file  . Number of children: Not on file  . Years of education: Not on file  . Highest education level: Not on file  Occupational History  . Not on file  Social Needs  . Financial resource strain: Not on file  . Food insecurity:    Worry: Not on file    Inability: Not on file  . Transportation needs:    Medical: Not on file    Non-medical: Not on file  Tobacco Use  . Smoking status: Current Every Day Smoker    Types: Cigarettes  Substance and Sexual Activity  . Alcohol use: No  . Drug use: No  . Sexual activity: Not on file  Lifestyle  . Physical activity:    Days per week: Not on file    Minutes per session: Not on file  . Stress: Not on file  Relationships  . Social  connections:    Talks on phone: Not on file    Gets together: Not on file    Attends religious service: Not on file    Active member of club or organization: Not on file    Attends meetings of clubs or organizations: Not on file    Relationship status: Not on file  . Intimate partner violence:    Fear of current or ex partner: Not on file    Emotionally abused: Not on file    Physically abused: Not on file    Forced sexual activity: Not on file  Other Topics Concern  . Not on file  Social History Narrative  . Not on file     Physical Exam  Vital Signs and Nursing Notes reviewed Vitals:   06/29/18 2053  BP: 139/87  Pulse: 100  Resp: 18  Temp: 98.4 F (36.9 C)  SpO2: 100%    CONSTITUTIONAL: Well-appearing, NAD NEURO:  Alert and oriented x 3, no focal deficits EYES:  eyes equal and reactive ENT/NECK:  no LAD, no JVD CARDIO: Regular rate, well-perfused, normal S1 and S2 PULM:  CTAB no wheezing or rhonchi GI/GU:  normal bowel sounds, non-distended, non-tender MSK/SPINE:  No gross deformities, no edema SKIN:  no  rash, atraumatic PSYCH:  Appropriate speech and behavior  Diagnostic and Interventional Summary    EKG Interpretation  Date/Time:  Friday June 29 2018 21:30:14 EST Ventricular Rate:  89 PR Interval:    QRS Duration: 89 QT Interval:  369 QTC Calculation: 449 R Axis:   43 Text Interpretation:  Sinus rhythm Abnormal R-wave progression, early transition Confirmed by Kennis Carina [phone]) on 06/29/2018 10:52:46 PM      Labs Reviewed  COMPREHENSIVE METABOLIC PANEL - Abnormal; Notable for the following components:      Result Value   Glucose, Bld 206 (*)    Creatinine, Ser 1.31 (*)    All other components within normal limits  LIPASE, BLOOD - Abnormal; Notable for the following components:   Lipase 85 (*)    All other components within normal limits  CBG MONITORING, ED - Abnormal; Notable for the following components:   Glucose-Capillary 184 (*)    All  other components within normal limits  CBC WITH DIFFERENTIAL/PLATELET  TROPONIN I    No orders to display    Medications - No data to display   Procedures   EMERGENCY DEPARTMENT BILIARY ULTRASOUND INTERPRETATION "Study: Limited Abdominal Ultrasound of the Gallbladder and Common Bile Duct."  INDICATIONS: Abdominal pain Indication: Multiple views of the gallbladder and common bile duct were obtained in real-time with a Multi-frequency probe."  PERFORMED BY:  Myself IMAGES ARCHIVED?: Yes LIMITATIONS: Abdominal pain INTERPRETATION: Normal   Critical Care  ED Course and Medical Decision Making  I have reviewed the triage vital signs and the nursing notes.  Pertinent labs & imaging results that were available during my care of the patient were reviewed by me and considered in my medical decision making (see below for details).  Favoring gastritis or GERD in this 48 year old male with epigastric pain worse when laying flat.  Also considering pancreatitis versus gallbladder pathology.  Low concern for cardiac etiology.  EKG un-concerning, troponin negative.  Mildly elevated lipase.  Bedside ultrasound reveals normal gallbladder, no gallstones.  Patient is feeling well, largely without pain currently, able to tolerate p.o., CT abdomen offered but patient prefers following up with PCP and/or GI as an outpatient.  Elmer Sow. Pilar Plate, MD [facility] Emergency Medicine [facility] [email or web address]  Final Clinical Impressions(s) / ED Diagnoses     ICD-10-CM   1. Epigastric pain R10.13     ED Discharge Orders         Ordered    sucralfate (CARAFATE) 1 g tablet  4 times daily PRN     06/29/18 2304    ondansetron (ZOFRAN ODT) 4 MG disintegrating tablet  Every 8 hours PRN     06/29/18 2304             Sabas Sous, MD 06/29/18 2305

## 2018-06-29 NOTE — ED Notes (Signed)
ED Provider at bedside. 

## 2018-06-29 NOTE — Discharge Instructions (Addendum)
You were evaluated in the Emergency Department and after careful evaluation, we did not find any emergent condition requiring admission or further testing in the hospital.  Your symptoms today seem to be due to a mild inflammation of the pancreas.  Please restrict your diet to clear liquids or broth for the next 1 to 2 days and use the medications provided as needed for pain.  Slowly advance your diet as your symptoms allow.  Please return to the Emergency Department if you experience any worsening of your condition.  We encourage you to follow up with a primary care provider.  Thank you for allowing [place] to be a part of your care.

## 2021-12-05 ENCOUNTER — Other Ambulatory Visit: Payer: Self-pay

## 2021-12-05 ENCOUNTER — Encounter (HOSPITAL_BASED_OUTPATIENT_CLINIC_OR_DEPARTMENT_OTHER): Payer: Self-pay | Admitting: Obstetrics and Gynecology

## 2021-12-05 ENCOUNTER — Emergency Department (HOSPITAL_BASED_OUTPATIENT_CLINIC_OR_DEPARTMENT_OTHER)
Admission: EM | Admit: 2021-12-05 | Discharge: 2021-12-05 | Disposition: A | Discharge status: Home / Self Care | Payer: BLUE CROSS/BLUE SHIELD | Attending: Emergency Medicine | Admitting: Emergency Medicine

## 2021-12-05 ENCOUNTER — Emergency Department (HOSPITAL_BASED_OUTPATIENT_CLINIC_OR_DEPARTMENT_OTHER): Payer: BLUE CROSS/BLUE SHIELD | Admitting: Radiology

## 2021-12-05 DIAGNOSIS — I10 Essential (primary) hypertension: Secondary | ICD-10-CM | POA: Insufficient documentation

## 2021-12-05 DIAGNOSIS — R079 Chest pain, unspecified: Secondary | ICD-10-CM | POA: Insufficient documentation

## 2021-12-05 DIAGNOSIS — Z7984 Long term (current) use of oral hypoglycemic drugs: Secondary | ICD-10-CM | POA: Diagnosis not present

## 2021-12-05 DIAGNOSIS — E119 Type 2 diabetes mellitus without complications: Secondary | ICD-10-CM | POA: Diagnosis not present

## 2021-12-05 LAB — TROPONIN I (HIGH SENSITIVITY)
Troponin I (High Sensitivity): 3 ng/L
Troponin I (High Sensitivity): 4 ng/L (ref <18)

## 2021-12-05 LAB — BASIC METABOLIC PANEL
Anion gap: 9 (ref 5–15)
BUN: 16 mg/dL (ref 6–20)
CO2: 24 mmol/L (ref 22–32)
Calcium: 10.5 mg/dL — ABNORMAL HIGH (ref 8.9–10.3)
Chloride: 106 mmol/L (ref 98–111)
Creatinine, Ser: 1.52 mg/dL — ABNORMAL HIGH (ref 0.61–1.24)
GFR, Estimated: 55 mL/min — ABNORMAL LOW (ref >60)
Glucose, Bld: 177 mg/dL — ABNORMAL HIGH (ref 70–99)
Potassium: 4.2 mmol/L (ref 3.5–5.1)
Sodium: 139 mmol/L (ref 135–145)

## 2021-12-05 LAB — CBC
HCT: 43.9 % (ref 39.0–52.0)
Hemoglobin: 13.9 g/dL (ref 13.0–17.0)
MCH: 26.7 pg (ref 26.0–34.0)
MCHC: 31.7 g/dL (ref 30.0–36.0)
MCV: 84.3 fL (ref 80.0–100.0)
Platelets: 212 10*3/uL (ref 150–400)
RBC: 5.21 MIL/uL (ref 4.22–5.81)
RDW: 13.7 % (ref 11.5–15.5)
WBC: 8.2 10*3/uL (ref 4.0–10.5)
nRBC: 0 % (ref 0.0–0.2)

## 2021-12-05 LAB — D-DIMER, QUANTITATIVE: D-Dimer, Quant: <0.27 ug/mL-FEU (ref 0.00–0.50)

## 2021-12-05 MED ORDER — KETOROLAC TROMETHAMINE 60 MG/2ML IM SOLN
60.0000 mg | Freq: Once | INTRAMUSCULAR | Status: AC
Start: 2021-12-05 — End: 2021-12-05
  Administered 2021-12-05: 60 mg via INTRAMUSCULAR
  Filled 2021-12-05: qty 2

## 2021-12-05 MED ORDER — ACETAMINOPHEN 325 MG PO TABS
650.0000 mg | ORAL_TABLET | Freq: Once | ORAL | Status: AC
  Administered 2021-12-05: 650 mg via ORAL
  Filled 2021-12-05: qty 2

## 2021-12-05 MED ORDER — PANTOPRAZOLE SODIUM 40 MG PO TBEC
40.0000 mg | DELAYED_RELEASE_TABLET | Freq: Once | ORAL | Status: AC
  Administered 2021-12-05: 40 mg via ORAL
  Filled 2021-12-05: qty 1

## 2021-12-05 MED ORDER — LIDOCAINE VISCOUS HCL 2 % MT SOLN
15.0000 mL | Freq: Once | OROMUCOSAL | Status: AC
  Administered 2021-12-05: 15 mL via ORAL
  Filled 2021-12-05: qty 15

## 2021-12-05 MED ORDER — ALUM & MAG HYDROXIDE-SIMETH 200-200-20 MG/5ML PO SUSP
30.0000 mL | Freq: Once | ORAL | Status: AC
  Administered 2021-12-05: 30 mL via ORAL
  Filled 2021-12-05: qty 30

## 2021-12-05 MED ORDER — OMEPRAZOLE 20 MG PO CPDR
20.0000 mg | DELAYED_RELEASE_CAPSULE | Freq: Every day | ORAL | 0 refills | Status: AC
Start: 2021-12-05 — End: ?

## 2021-12-05 NOTE — Discharge Instructions (Signed)
You will take the antacid daily for the next 2 weeks.  It takes approximately 4 to 5 days to kick in so you can always take Tums and Tylenol on top of that.  If you start having severe shortness of breath, passing out, vomiting and not holding anything down return to the emergency room.  Avoid ibuprofen products, aspirin products or alcohol.

## 2021-12-05 NOTE — ED Notes (Signed)
Pt verbalizes understanding of discharge instructions. Opportunity for questioning and answers were provided. Pt discharged from ED to home with significant other.   ? ?

## 2021-12-05 NOTE — ED Triage Notes (Signed)
Patient reports left sided chest pain that is making him feel abnormal. Patient reports he though this was indigestion but it has worsened since Friday. Patient reports it is going down his left arm and into his back.

## 2021-12-05 NOTE — ED Provider Notes (Signed)
[facility] Provider [facility]   CSN: [id number] Arrival date & time: 12/05/21  1713     History  Chief Complaint  Patient presents with   Chest Pain    Benjamin MarshallCharles E Kimberling is a 51 y.o. male.  Patient is a 51 year old male with a history of hypertension and diabetes presenting today with a complaint of chest pain.  Patient reports since Tuesday he has been dealing with indigestion which is a burning sensation in the center of his chest that caused him to have 1 episode of vomiting on Wednesday.  He did take Zantac with some improvement of that discomfort but he reports yesterday when he woke up he had a pain in the left side of his chest.  It does not radiate into his arms or his back but he does notice it is worse with burping, taking a deep breath or sneezing or coughing.  Even some movement seems to make the pain worse.  He reports overall he is just not felt quite normal since Tuesday.  He denies any shortness of breath, near syncope.  He has had no further vomiting and denies any diarrhea.  He has had no change in his medications.  He does use tobacco intermittently and reports that he occasionally will drink alcohol but has last had alcohol approximately 2 weeks ago.  He denies any urinary symptoms.  No family history of early cardiac disease.  His dad recently had a stent placed but he is in his 6570s.  Patient has never had any significant cardiac work-up.  The history is provided by the patient.  Chest Pain      Home Medications Prior to Admission medications   Medication Sig Start Date End Date Taking? Authorizing Provider  omeprazole (PRILOSEC) 20 MG capsule Take 1 capsule (20 mg total) by mouth daily. 12/05/21  Yes Gwyneth SproutPlunkett, Naarah Borgerding, MD  glipiZIDE (GLUCOTROL) 5 MG tablet Take 0.5 tablets (2.5 mg total) by mouth daily before breakfast. 05/04/18   Benjiman CorePickering, Nathan, MD  metFORMIN (GLUCOPHAGE) 1000 MG tablet Take 3 mg by mouth 2 (two) times daily with a meal.     [provider]  ondansetron (ZOFRAN ODT) 4 MG disintegrating tablet Take 1 tablet (4 mg total) by mouth every 8 (eight) hours as needed for nausea or vomiting. 06/29/18   Sabas SousBero, Michael M, MD  sucralfate (CARAFATE) 1 g tablet Take 1 tablet (1 g total) by mouth 4 (four) times daily as needed. 06/29/18   Sabas SousBero, Michael M, MD      Allergies    Penicillins    Review of Systems   Review of Systems  Cardiovascular:  Positive for chest pain.    Physical Exam Updated Vital Signs BP (!) 148/112   Pulse 70   Temp 98.3 F (36.8 C)   Resp 16   Ht 5\' 10"  (1.778 m)   Wt 93 kg   SpO2 100%   BMI 29.41 kg/m  Physical Exam Vitals and nursing note reviewed.  Constitutional:      General: He is not in acute distress.    Appearance: He is well-developed.  HENT:     Head: Normocephalic and atraumatic.  Eyes:     Conjunctiva/sclera: Conjunctivae normal.     Pupils: Pupils are equal, round, and reactive to light.  Cardiovascular:     Rate and Rhythm: Normal rate and regular rhythm.     Heart sounds: No murmur heard. Pulmonary:     Effort: Pulmonary effort is normal. No  respiratory distress.     Breath sounds: Normal breath sounds. No wheezing or rales.  Abdominal:     General: There is no distension.     Palpations: Abdomen is soft.     Tenderness: There is abdominal tenderness. There is no right CVA tenderness, left CVA tenderness, guarding or rebound.     Comments: Mild upper abdominal tenderness in the left upper quadrant  Musculoskeletal:        General: No tenderness. Normal range of motion.     Cervical back: Normal range of motion and neck supple.  Skin:    General: Skin is warm and dry.     Findings: No erythema or rash.  Neurological:     Mental Status: He is alert and oriented to person, place, and time.  Psychiatric:        Behavior: Behavior normal.     ED Results / Procedures / Treatments   Labs (all labs ordered are listed, but only abnormal results are  displayed) Labs Reviewed  BASIC METABOLIC PANEL - Abnormal; Notable for the following components:      Result Value   Glucose, Bld 177 (*)    Creatinine, Ser 1.52 (*)    Calcium 10.5 (*)    GFR, Estimated 55 (*)    All other components within normal limits  CBC  D-DIMER, QUANTITATIVE  TROPONIN I (HIGH SENSITIVITY)  TROPONIN I (HIGH SENSITIVITY)    EKG EKG Interpretation  Date/Time:  Sunday December 05 2021 17:19:49 EDT Ventricular Rate:  89 PR Interval:  154 QRS Duration: 82 QT Interval:  332 QTC Calculation: 403 R Axis:   34 Text Interpretation: Normal sinus rhythm Normal ECG When compared with ECG of 29-Jun-2018 21:30, PREVIOUS ECG IS PRESENT No significant change since last tracing Confirmed by Gwyneth Sprout ([id number]) on 12/05/2021 7:20:56 PM  Radiology DG Chest 2 View  Result Date: 12/05/2021 CLINICAL DATA:  Chest pain EXAM: CHEST - 2 VIEW COMPARISON:  06/01/2016 FINDINGS: The heart size and mediastinal contours are within normal limits. Both lungs are clear. The visualized skeletal structures are unremarkable. IMPRESSION: No active cardiopulmonary disease. Electronically Signed   By: Ernie Avena M.D.   On: 12/05/2021 18:00    Procedures Procedures    Medications Ordered in ED Medications  pantoprazole (PROTONIX) EC tablet 40 mg (has no administration in time range)  acetaminophen (TYLENOL) tablet 650 mg (has no administration in time range)  ketorolac (TORADOL) injection 60 mg (has no administration in time range)  alum & mag hydroxide-simeth (MAALOX/MYLANTA) 200-200-20 MG/5ML suspension 30 mL (30 mLs Oral Given 12/05/21 1958)    And  lidocaine (XYLOCAINE) 2 % viscous mouth solution 15 mL (15 mLs Oral Given 12/05/21 1958)    ED Course/ Medical Decision Making/ A&P                           Medical Decision Making Amount and/or Complexity of Data Reviewed Labs: ordered. Decision-making details documented in ED Course. Radiology: ordered and independent  interpretation performed. Decision-making details documented in ED Course. ECG/medicine tests: ordered and independent interpretation performed. Decision-making details documented in ED Course.  Risk OTC drugs. Prescription drug management.   Pt with multiple medical problems and comorbidities and presenting today with a complaint that caries a high risk for morbidity and mortality.  Presenting today with chest pain.  Patient's chest pain is nonspecific and seems to be more pleuritic in nature however patient is a heart score of  3 based on risk factors with hypertension, tobacco use and diabetes.  He does have some minimal upper abdominal tenderness but no findings to suggest cholecystitis.  Patient does not have any symptoms suggesting pneumonia.  EKG based on my independent interpretation today is within normal limits without evidence of pericarditis or ST changes concerning for MI or ACS.  I independently interpreted patient's labs today and his CBC CMP and troponin are all within normal limits.  Patient was low risk Wells score and a D-dimer was done which is also negative.  Low suspicion for dissection or PE at this time. I have independently visualized and interpreted pt's images today. Chest x-ray is within normal limits today. Suspect patient's symptoms are GI in nature versus musculoskeletal.  Encourage patient to follow-up with PCP.  We will start GI medication and Tylenol.  No indication for admission at this time.  Patient has been hypertensive throughout his stay today however he has not taken his lisinopril today and will do that when he gets home.  Encouraged follow-up with PCP he was given a dose of omeprazole and pain control here.         Final Clinical Impression(s) / ED Diagnoses Final diagnoses:  Nonspecific chest pain    Rx / DC Orders ED Discharge Orders          Ordered    omeprazole (PRILOSEC) 20 MG capsule  Daily        12/05/21 2116               Gwyneth Sprout, MD 12/05/21 2116

## 2022-03-08 ENCOUNTER — Emergency Department (HOSPITAL_BASED_OUTPATIENT_CLINIC_OR_DEPARTMENT_OTHER)
Admission: EM | Admit: 2022-03-08 | Discharge: 2022-03-08 | Disposition: A | Discharge status: Home / Self Care | Payer: BLUE CROSS/BLUE SHIELD | Attending: Emergency Medicine | Admitting: Emergency Medicine

## 2022-03-08 ENCOUNTER — Other Ambulatory Visit: Payer: Self-pay

## 2022-03-08 ENCOUNTER — Emergency Department (HOSPITAL_BASED_OUTPATIENT_CLINIC_OR_DEPARTMENT_OTHER): Payer: BLUE CROSS/BLUE SHIELD

## 2022-03-08 ENCOUNTER — Encounter (HOSPITAL_BASED_OUTPATIENT_CLINIC_OR_DEPARTMENT_OTHER): Payer: Self-pay | Admitting: Emergency Medicine

## 2022-03-08 DIAGNOSIS — K5900 Constipation, unspecified: Secondary | ICD-10-CM | POA: Insufficient documentation

## 2022-03-08 DIAGNOSIS — R1013 Epigastric pain: Secondary | ICD-10-CM

## 2022-03-08 DIAGNOSIS — K859 Acute pancreatitis without necrosis or infection, unspecified: Secondary | ICD-10-CM | POA: Insufficient documentation

## 2022-03-08 LAB — URINALYSIS, ROUTINE W REFLEX MICROSCOPIC
Bilirubin Urine: NEGATIVE
Glucose, UA: NEGATIVE mg/dL
Ketones, ur: 15 mg/dL — AB
Leukocytes,Ua: NEGATIVE
Nitrite: NEGATIVE
Protein, ur: 30 mg/dL — AB
Specific Gravity, Urine: >1.046 — ABNORMAL HIGH (ref 1.005–1.030)
pH: 5.5 (ref 5.0–8.0)

## 2022-03-08 MED ORDER — ONDANSETRON 4 MG PO TBDP: 4.0000 mg | ORAL_TABLET | Freq: Three times a day (TID) | ORAL | 0 refills | Status: DC | PRN

## 2022-03-08 MED ORDER — MORPHINE SULFATE (PF) 4 MG/ML IV SOLN
8.0000 mg | Freq: Once | INTRAVENOUS | Status: AC
  Administered 2022-03-08: 8 mg via INTRAVENOUS
  Filled 2022-03-08: qty 2

## 2022-03-08 MED ORDER — LACTATED RINGERS IV BOLUS
1000.0000 mL | Freq: Once | INTRAVENOUS | Status: AC
  Administered 2022-03-08: 1000 mL via INTRAVENOUS

## 2022-03-08 MED ORDER — ACETAMINOPHEN 325 MG PO TABS
650.0000 mg | ORAL_TABLET | Freq: Once | ORAL | Status: AC
  Administered 2022-03-08: 650 mg via ORAL
  Filled 2022-03-08: qty 2

## 2022-03-08 MED ORDER — ONDANSETRON HCL 4 MG/2ML IJ SOLN
4.0000 mg | Freq: Once | INTRAMUSCULAR | Status: AC
  Administered 2022-03-08: 4 mg via INTRAVENOUS
  Filled 2022-03-08: qty 2

## 2022-03-08 NOTE — Discharge Instructions (Signed)
Today you were seen in the emergency department for your pancreatitis.    In the emergency department you were given pain medications and fluids.    At home, please continue taking Tylenol and using heating pads for your pain.  Take Zofran as needed for any nausea or vomiting that you have and MiraLAX for any constipation.    Follow-up with your primary doctor in 2-3 days regarding your visit.  The GI doctors will call you about an appointment.  Return immediately to the emergency department if you experience any of the following: Worsening pain, vomiting, or any other concerning symptoms.    Thank you for visiting our Emergency Department. It was a pleasure taking care of you today.

## 2022-03-08 NOTE — ED Provider Notes (Signed)
[facility] EMERGENCY DEPT Provider Note   CSN: [id number] Arrival date & time: 03/08/22  1630     History  Chief Complaint  Patient presents with   Abdominal Pain   Constipation    Benjamin Branch is a 52 y.o. male.  51 year old male with a history of gallstones and pancreatitis who presents emergency department with abdominal pain referred in by his primary doctor.  Patient reports that over the weekend he started having epigastric abdominal pain.  Says that severe and radiates to his back.  Says he has also had nausea but no vomiting.  Last drink on Friday and thinks it was 1-2 standard drinks.  Does not have history of alcoholism that he reports.  Does have a history of gallstones but still has gallbladder.  Was diagnosed with pancreatitis years ago but says that he has not had an episode since.  Went to his primary care today and was found to have an elevated lipase and had a CT scan that did confirm pancreatitis and referred into the emergency department for pain control and nausea control.   Abdominal Pain Associated symptoms: constipation   Constipation Associated symptoms: abdominal pain        Home Medications Prior to Admission medications   Medication Sig Start Date End Date Taking? Authorizing Provider  glipiZIDE (GLUCOTROL) 5 MG tablet Take 0.5 tablets (2.5 mg total) by mouth daily before breakfast. 05/04/18   Benjamin Core, MD  metFORMIN (GLUCOPHAGE) 1000 MG tablet Take 3 mg by mouth 2 (two) times daily with a meal.    [provider]  omeprazole (PRILOSEC) 20 MG capsule Take 1 capsule (20 mg total) by mouth daily. 12/05/21   Gwyneth Sprout, MD  ondansetron (ZOFRAN-ODT) 4 MG disintegrating tablet Take 1 tablet (4 mg total) by mouth every 8 (eight) hours as needed for up to 12 doses for nausea or vomiting. 03/08/22   Rondel Baton, MD  sucralfate (CARAFATE) 1 g tablet Take 1 tablet (1 g total) by mouth 4 (four) times daily as needed.  06/29/18   Sabas Sous, MD      Allergies    Penicillins    Review of Systems   Review of Systems  Gastrointestinal:  Positive for abdominal pain and constipation.    Physical Exam Updated Vital Signs BP (!) 127/90   Pulse 69   Temp 98.2 F (36.8 C) (Oral)   Resp 14   SpO2 100%  Physical Exam Vitals and nursing note reviewed.  Constitutional:      General: He is not in acute distress.    Appearance: He is well-developed.  HENT:     Head: Normocephalic and atraumatic.     Right Ear: External ear normal.     Left Ear: External ear normal.     Nose: Nose normal.  Eyes:     Extraocular Movements: Extraocular movements intact.     Conjunctiva/sclera: Conjunctivae normal.     Pupils: Pupils are equal, round, and reactive to light.  Cardiovascular:     Rate and Rhythm: Normal rate and regular rhythm.     Heart sounds: Normal heart sounds.  Pulmonary:     Effort: Pulmonary effort is normal. No respiratory distress.     Breath sounds: Normal breath sounds.  Abdominal:     General: There is no distension.     Palpations: Abdomen is soft. There is no mass.     Tenderness: There is abdominal tenderness (epigastrium). There is no guarding.  Musculoskeletal:  General: No swelling.     Cervical back: Normal range of motion and neck supple.     Right lower leg: No edema.     Left lower leg: No edema.  Skin:    General: Skin is warm and dry.     Capillary Refill: Capillary refill takes less than 2 seconds.  Neurological:     Mental Status: He is alert. Mental status is at baseline.  Psychiatric:        Mood and Affect: Mood normal.        Behavior: Behavior normal.     ED Results / Procedures / Treatments   Labs (all labs ordered are listed, but only abnormal results are displayed) Labs Reviewed  URINALYSIS, ROUTINE W REFLEX MICROSCOPIC - Abnormal; Notable for the following components:      Result Value   Specific Gravity, Urine >1.046 (*)    Hgb urine  dipstick MODERATE (*)    Ketones, ur 15 (*)    Protein, ur 30 (*)    All other components within normal limits    EKG None  Radiology US Abdomen Limited RUQ (LIVER/GB)  Result Date: 03/08/2022 CLINICAL DATA:  Abdominal pain.  Pancreatitis. EXAM: ULTRASOUND ABDOMEN LIMITED RIGHT UPPER QUADRANT COMPARISON:  None Available. FINDINGS: Gallbladder: No gallstones or wall thickening visualized. No sonographic Murphy sign noted by sonographer. Common bile duct: Diameter: 5.2 mm Liver: No focal lesion identified. Within normal limits in parenchymal echogenicity. Portal vein is patent on color Doppler imaging with normal direction of blood flow towards the liver. Other: None. IMPRESSION: Normal right upper quadrant ultrasound. No gallstones or biliary dilatation. Electronically Signed   By: Marlan Palau M.D.   On: 03/08/2022 19:46    Procedures Procedures   Medications Ordered in ED Medications  morphine (PF) 4 MG/ML injection 8 mg (8 mg Intravenous Given 03/08/22 1911)  ondansetron (ZOFRAN) injection 4 mg (4 mg Intravenous Given 03/08/22 1909)  lactated ringers bolus 1,000 mL ( Intravenous Stopped 03/08/22 2014)  acetaminophen (TYLENOL) tablet 650 mg (650 mg Oral Given 03/08/22 2259)    ED Course/ Medical Decision Making/ A&P                           Medical Decision Making Amount and/or Complexity of Data Reviewed Labs: ordered. Radiology: ordered.  Risk OTC drugs. Prescription drug management.   Benjamin Branch is a 51 year old male with a history of gallstones and pancreatitis who presents emergency department with abdominal pain referred in by his primary doctor.  Initial Ddx:  Pancreatitis, gastritis, gallstone pancreatitis  MDM:  Unclear was causing the patient's symptoms but it was likely due to pancreatitis.  Could be related to his alcohol use though does not appear there is excessive.  Also on the differential will be gallstone pancreatitis so we will obtain an  ultrasound at this time.  Given the patient's work-up do not feel that we need to repeat all of his labs or CT scan.  We will treat the patient symptomatically at this time for his pancreatitis.  Plan:  Right upper quadrant ultrasound Morphine Tylenol Zofran IV fluids  ED Summary:  Right upper quadrant ultrasound did not show evidence of gallstone.  Patient was feeling much better after IV pain medication.  He is able to tolerate p.o. without difficulty.  He was requesting to go home.  I did offer the patient an oxycodone prescription but he declined and stated that he would rather take  Tylenol and use heating packs for his abdominal pain.  States that he is unable to take NSAIDs because of his kidney function.  We will have the patient follow-up with a primary doctor regarding his symptoms and also placed ambulatory referral to GI to further evaluate his pancreatitis and the etiology.  Instructed the patient to gradually return to eating since heavy meals could exacerbate his pancreatitis.  Dispo: DC Home. Return precautions discussed including, but not limited to, those listed in the AVS. Allowed pt time to ask questions which were answered fully prior to dc.   Records reviewed OP Notes  Final Clinical Impression(s) / ED Diagnoses Final diagnoses:  Acute pancreatitis, unspecified complication status, unspecified pancreatitis type  Epigastric pain  Constipation, unspecified constipation type    Rx / DC Orders ED Discharge Orders          Ordered    Ambulatory referral to Gastroenterology        03/08/22 2226    ondansetron (ZOFRAN-ODT) 4 MG disintegrating tablet  Every 8 hours PRN,   Status:  Discontinued        03/08/22 2228    ondansetron (ZOFRAN-ODT) 4 MG disintegrating tablet  Every 8 hours PRN        03/08/22 2254              Rondel Baton, MD 03/09/22 1229

## 2022-03-08 NOTE — ED Triage Notes (Addendum)
Upper abdominal pain for last 2-3 days. History of pancreatitis. Also report constipation. Tylenol with no relief last dose 9:40am  Patient states he has CT and blood work done today thru primary  Results in care everywhere:  Lipase: 106  CT: Subtle stranding along the pancreaticoduodenal groove may reflect  mild acute interstitial pancreatitis suggest correlation with serum  lipase.  2. Prior partial left nephrectomy with similar ill-defined soft  tissue/stranding in the surgical bed, favored to reflect  postsurgical change.  3. Moderate volume of formed stool throughout the colon suggestive  of constipation.

## 2022-03-08 NOTE — ED Notes (Signed)
Pt tolerated PO fluids

## 2022-11-05 ENCOUNTER — Encounter (HOSPITAL_BASED_OUTPATIENT_CLINIC_OR_DEPARTMENT_OTHER): Payer: Self-pay | Admitting: Emergency Medicine

## 2022-11-05 ENCOUNTER — Emergency Department (HOSPITAL_BASED_OUTPATIENT_CLINIC_OR_DEPARTMENT_OTHER): Payer: BLUE CROSS/BLUE SHIELD

## 2022-11-05 ENCOUNTER — Other Ambulatory Visit: Payer: Self-pay

## 2022-11-05 ENCOUNTER — Emergency Department (HOSPITAL_BASED_OUTPATIENT_CLINIC_OR_DEPARTMENT_OTHER)
Admission: EM | Admit: 2022-11-05 | Discharge: 2022-11-05 | Disposition: A | Discharge status: Home / Self Care | Payer: BLUE CROSS/BLUE SHIELD | Attending: Emergency Medicine | Admitting: Emergency Medicine

## 2022-11-05 DIAGNOSIS — R748 Abnormal levels of other serum enzymes: Secondary | ICD-10-CM | POA: Insufficient documentation

## 2022-11-05 DIAGNOSIS — K859 Acute pancreatitis without necrosis or infection, unspecified: Secondary | ICD-10-CM | POA: Diagnosis not present

## 2022-11-05 DIAGNOSIS — R1013 Epigastric pain: Secondary | ICD-10-CM | POA: Diagnosis present

## 2022-11-05 HISTORY — DX: Acute pancreatitis without necrosis or infection, unspecified: K85.90

## 2022-11-05 LAB — CBC WITH DIFFERENTIAL/PLATELET
Abs Immature Granulocytes: 0.01 10*3/uL (ref 0.00–0.07)
Basophils Absolute: 0.1 10*3/uL (ref 0.0–0.1)
Basophils Relative: 1 %
Eosinophils Absolute: 0.1 10*3/uL (ref 0.0–0.5)
Eosinophils Relative: 1 %
HCT: 39.8 % (ref 39.0–52.0)
Hemoglobin: 13 g/dL (ref 13.0–17.0)
Immature Granulocytes: 0 %
Lymphocytes Relative: 45 %
Lymphs Abs: 3.6 10*3/uL (ref 0.7–4.0)
MCH: 27.3 pg (ref 26.0–34.0)
MCHC: 32.7 g/dL (ref 30.0–36.0)
MCV: 83.4 fL (ref 80.0–100.0)
Monocytes Absolute: 0.5 10*3/uL (ref 0.1–1.0)
Monocytes Relative: 7 %
Neutro Abs: 3.8 10*3/uL (ref 1.7–7.7)
Neutrophils Relative %: 46 %
Platelets: 275 10*3/uL (ref 150–400)
RBC: 4.77 MIL/uL (ref 4.22–5.81)
RDW: 13.8 % (ref 11.5–15.5)
WBC: 8 10*3/uL (ref 4.0–10.5)
nRBC: 0 % (ref 0.0–0.2)

## 2022-11-05 LAB — COMPREHENSIVE METABOLIC PANEL
ALT: 8 U/L (ref 0–44)
AST: 11 U/L — ABNORMAL LOW (ref 15–41)
Albumin: 4 g/dL (ref 3.5–5.0)
Alkaline Phosphatase: 84 U/L (ref 38–126)
Anion gap: 8 (ref 5–15)
BUN: 21 mg/dL — ABNORMAL HIGH (ref 6–20)
CO2: 24 mmol/L (ref 22–32)
Calcium: 9.3 mg/dL (ref 8.9–10.3)
Chloride: 107 mmol/L (ref 98–111)
Creatinine, Ser: 1.51 mg/dL — ABNORMAL HIGH (ref 0.61–1.24)
GFR, Estimated: 56 mL/min — ABNORMAL LOW (ref >60)
Glucose, Bld: 128 mg/dL — ABNORMAL HIGH (ref 70–99)
Potassium: 4.1 mmol/L (ref 3.5–5.1)
Sodium: 139 mmol/L (ref 135–145)
Total Bilirubin: 0.4 mg/dL (ref 0.3–1.2)
Total Protein: 7.2 g/dL (ref 6.5–8.1)

## 2022-11-05 LAB — LIPASE, BLOOD: Lipase: 468 U/L — ABNORMAL HIGH (ref 11–51)

## 2022-11-05 MED ORDER — ONDANSETRON HCL 4 MG/2ML IJ SOLN
4.0000 mg | Freq: Once | INTRAMUSCULAR | Status: AC
  Administered 2022-11-05: 4 mg via INTRAVENOUS
  Filled 2022-11-05: qty 2

## 2022-11-05 MED ORDER — OXYCODONE-ACETAMINOPHEN 5-325 MG PO TABS: 1.0000 | ORAL_TABLET | Freq: Four times a day (QID) | ORAL | 0 refills | Status: DC | PRN

## 2022-11-05 MED ORDER — IOHEXOL 300 MG/ML  SOLN
100.0000 mL | Freq: Once | INTRAMUSCULAR | Status: AC | PRN
  Administered 2022-11-05: 80 mL via INTRAVENOUS

## 2022-11-05 MED ORDER — ONDANSETRON 4 MG PO TBDP: 4.0000 mg | ORAL_TABLET | Freq: Three times a day (TID) | ORAL | 0 refills | Status: AC | PRN

## 2022-11-05 MED ORDER — MORPHINE SULFATE (PF) 4 MG/ML IV SOLN
4.0000 mg | Freq: Once | INTRAVENOUS | Status: AC
  Administered 2022-11-05: 4 mg via INTRAVENOUS
  Filled 2022-11-05: qty 1

## 2022-11-05 MED ORDER — SODIUM CHLORIDE 0.9 % IV BOLUS
1000.0000 mL | Freq: Once | INTRAVENOUS | Status: AC
  Administered 2022-11-05: 1000 mL via INTRAVENOUS

## 2022-11-05 NOTE — Discharge Instructions (Addendum)
Continue your clear liquid diet. Take pain and nausea medications as needed, do not mix with additional tylenol (acetaminophen).

## 2022-11-05 NOTE — ED Triage Notes (Signed)
Pt c/o abdominal pain x 2 weeks. Pt states that his pcp told him to come in "for a morphine gtt and a MRI of his pancreas."

## 2022-11-05 NOTE — ED Provider Notes (Signed)
[place] EMERGENCY DEPARTMENT AT [facility]  Provider Note  CSN: [id number] Arrival date & time: 11/05/22 0449  History Chief Complaint  Patient presents with   Abdominal Pain    Benjamin Branch is a 52 y.o. 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 Saw PCP 2 days ago and had labs done showing mildly elevated lipase, otherwise normal LFTs. In the past he has had CT, MRI, and endoscopy as part of his workup without underlying cause. He denies any EtOH use. Last [place] did not show gall stones. He was told by his doctor to come to the ER for 'an MRI and a morphine drip'. He was given a Rx for norco which made him sleepy but did not improve his pain.    Home Medications Prior to Admission medications   Medication Sig Start Date End Date Taking? Authorizing Provider  oxyCODONE-acetaminophen (PERCOCET/ROXICET) 5-325 MG tablet Take 1 tablet by mouth every 6 (six) hours as needed for severe pain. 11/05/22  Yes Pollyann Savoy, MD  glipiZIDE (GLUCOTROL) 5 MG tablet Take 0.5 tablets (2.5 mg total) by mouth daily before breakfast. 05/04/18   [name], MD  metFORMIN (GLUCOPHAGE) 1000 MG tablet Take 3 mg by mouth 2 (two) times daily with a meal.    [provider]  omeprazole (PRILOSEC) 20 MG capsule Take 1 capsule (20 mg total) by mouth daily. 12/05/21   Gwyneth Sprout, MD  ondansetron (ZOFRAN-ODT) 4 MG disintegrating tablet Take 1 tablet (4 mg total) by mouth every 8 (eight) hours as needed for up to 12 doses for nausea or vomiting. 11/05/22   Pollyann Savoy, MD  sucralfate (CARAFATE) 1 g tablet Take 1 tablet (1 g total) by mouth 4 (four) times daily as needed. 06/29/18   Sabas Sous, MD     Allergies    Penicillins   Review of Systems   Review of Systems Please see HPI for pertinent positives and negatives  Physical Exam BP (!) 137/91   Pulse 68   Temp 98.6 F (37 C)   Resp 18   Ht 5\' 10"  (1.778 m)    Wt 93 kg   SpO2 100%   BMI 29.42 kg/m   Physical Exam Vitals and nursing note reviewed.  Constitutional:      Appearance: Normal appearance.  HENT:     Head: Normocephalic and atraumatic.     Nose: Nose normal.     Mouth/Throat:     Mouth: Mucous membranes are moist.  Eyes:     Extraocular Movements: Extraocular movements intact.     Conjunctiva/sclera: Conjunctivae normal.  Cardiovascular:     Rate and Rhythm: Normal rate.  Pulmonary:     Effort: Pulmonary effort is normal.     Breath sounds: Normal breath sounds.  Abdominal:     General: Abdomen is flat.     Palpations: Abdomen is soft.     Tenderness: There is abdominal tenderness in the epigastric area. There is no guarding. Negative signs include Murphy's sign and McBurney's sign.  Musculoskeletal:        General: No swelling. Normal range of motion.     Cervical back: Neck supple.  Skin:    General: Skin is warm and dry.  Neurological:     General: No focal deficit present.     Mental Status: He is alert.  Psychiatric:        Mood and Affect: Mood normal.  ED Results / Procedures / Treatments   EKG None  Procedures Procedures  Medications Ordered in the ED Medications  morphine (PF) 4 MG/ML injection 4 mg (4 mg Intravenous Given 11/05/22 0532)  ondansetron (ZOFRAN) injection 4 mg (4 mg Intravenous Given 11/05/22 0531)  sodium chloride 0.9 % bolus 1,000 mL (0 mLs Intravenous Stopped 11/05/22 0625)  iohexol (OMNIPAQUE) 300 MG/ML solution 100 mL (80 mLs Intravenous Contrast Given 11/05/22 0625)    Initial Impression and Plan  Patient here for likely exacerbation of pancreatitis. I explained we do not have MRI available here and that emergent MRI of abdomen is not indicated regardless. Will check labs and give pain/nausea meds for comfort.   ED Course   Clinical Course as of 11/05/22 0656  Sat Nov 05, 2022  0544 CBC is normal.  [CS]  [phone] CMP with normal LFTs, Creatinine is at baseline. Lipase is  increased from previous. Will send for CT to evaluate his pancreas. Pain is well controlled at this time.  [CS]  [phone] I personally viewed the images from radiology studies and agree with radiologist interpretation: CT shows mild pancreatitis, no other complications. Patient reports pain is well controlled and he wants to go home. Will send Rx for pain/nausea meds. Recommend he continue with clear liquid diet and follow up with PCP closely in 2 days. RTED if pain worsens, uncontrolled vomiting, fever or any other concerns.  [CS]    Clinical Course User Index [CS] Pollyann Savoy, MD     MDM Rules/Calculators/A&P Medical Decision Making Given presenting complaint, I considered that admission might be necessary. After review of results from ED lab and/or imaging studies, admission to the hospital is not indicated at this time.    Problems Addressed: Acute pancreatitis without infection or necrosis, unspecified pancreatitis type: acute illness or injury  Amount and/or Complexity of Data Reviewed Labs: ordered. Decision-making details documented in ED Course. Radiology: ordered and independent interpretation performed. Decision-making details documented in ED Course.  Risk Prescription drug management. Parenteral controlled substances. Decision regarding hospitalization.     Final Clinical Impression(s) / ED Diagnoses Final diagnoses:  Acute pancreatitis without infection or necrosis, unspecified pancreatitis type    Rx / DC Orders ED Discharge Orders          Ordered    ondansetron (ZOFRAN-ODT) 4 MG disintegrating tablet  Every 8 hours PRN        11/05/22 0655    oxyCODONE-acetaminophen (PERCOCET/ROXICET) 5-325 MG tablet  Every 6 hours PRN        11/05/22 0655             Pollyann Savoy, MD 11/05/22 [phone]

## 2023-07-05 IMAGING — DX DG CHEST 2V
2 series · 2 of 2 positions shown · non-contrast
Comparison: 06/01/2016

CLINICAL DATA: Chest pain

EXAM:
CHEST - 2 VIEW

[chest pa]
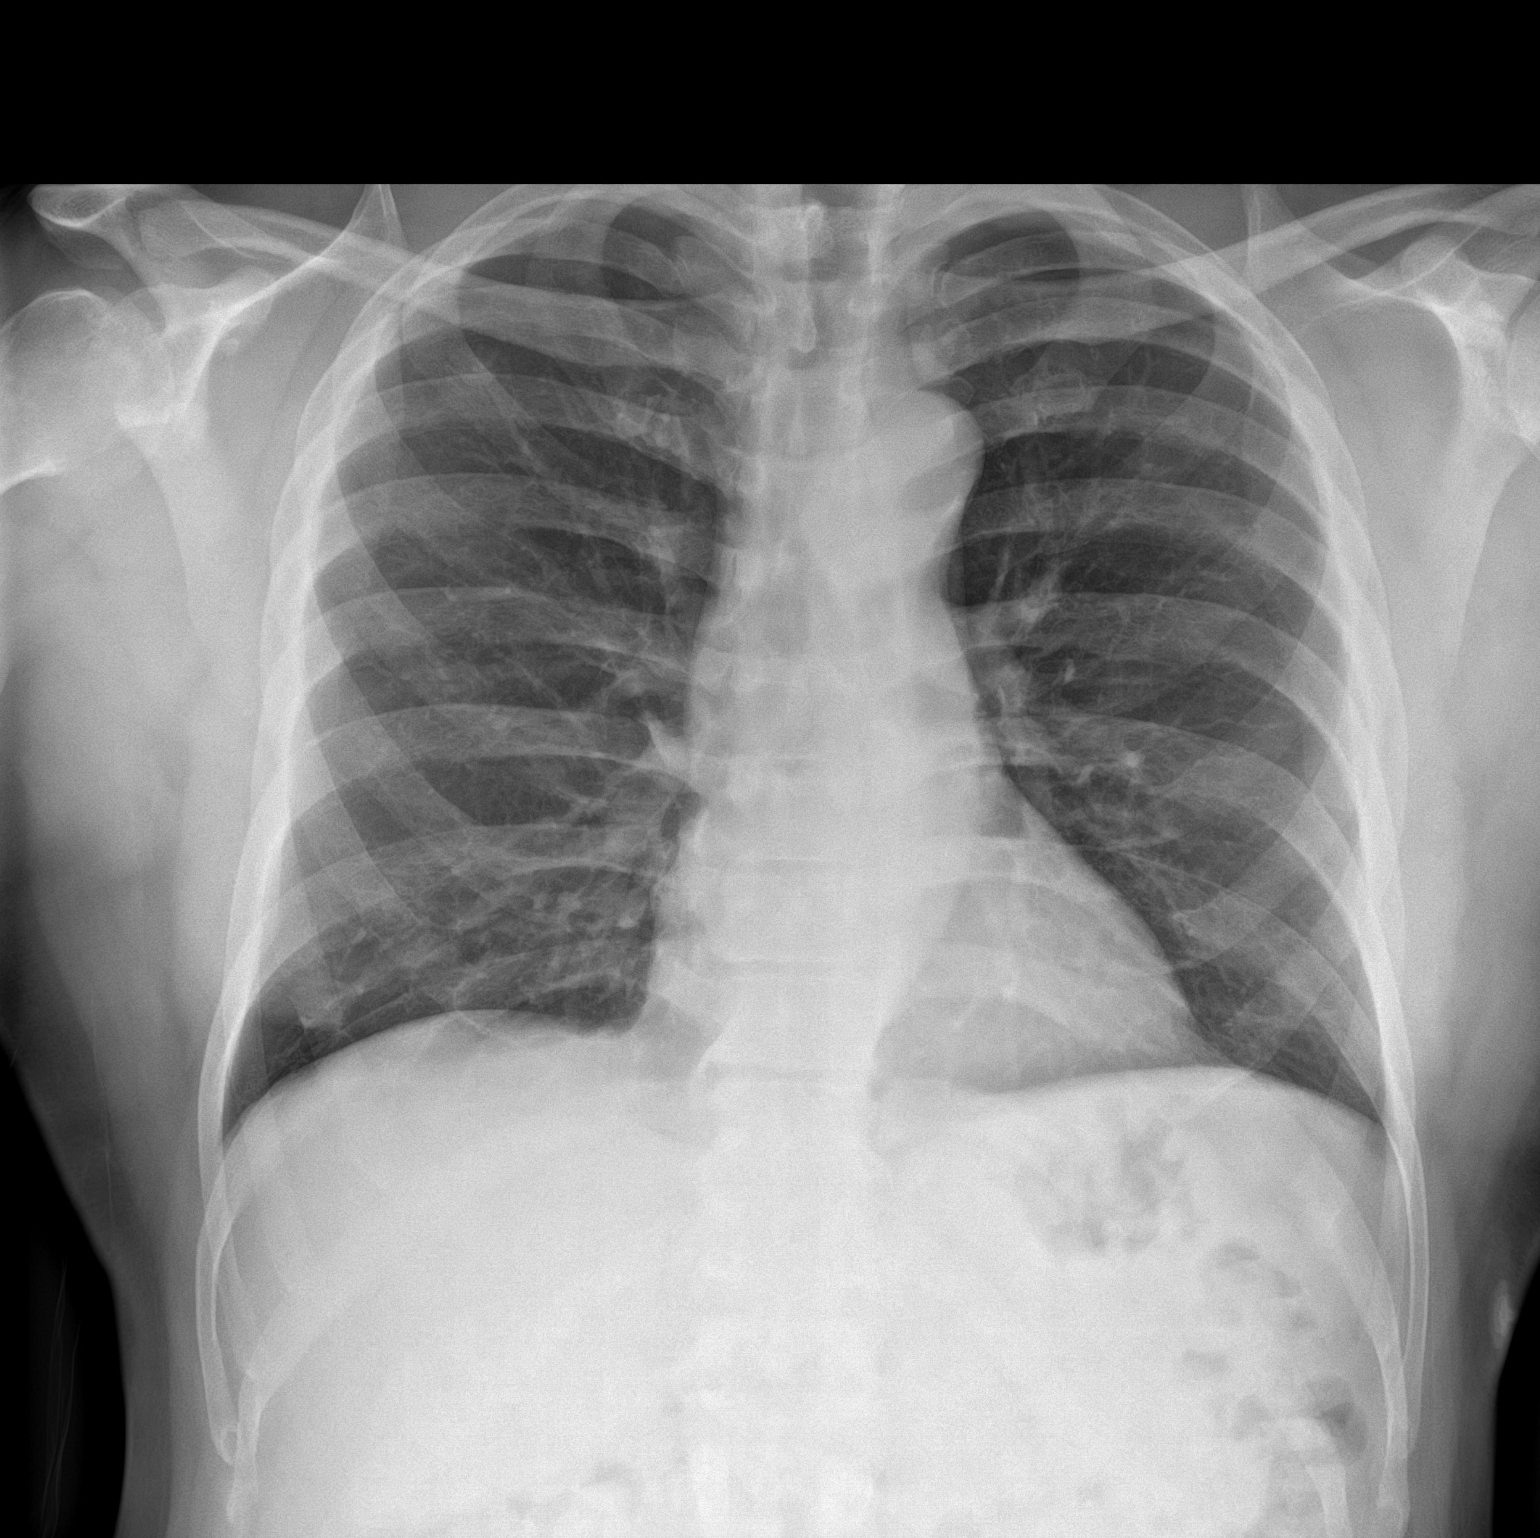

[chest lat]
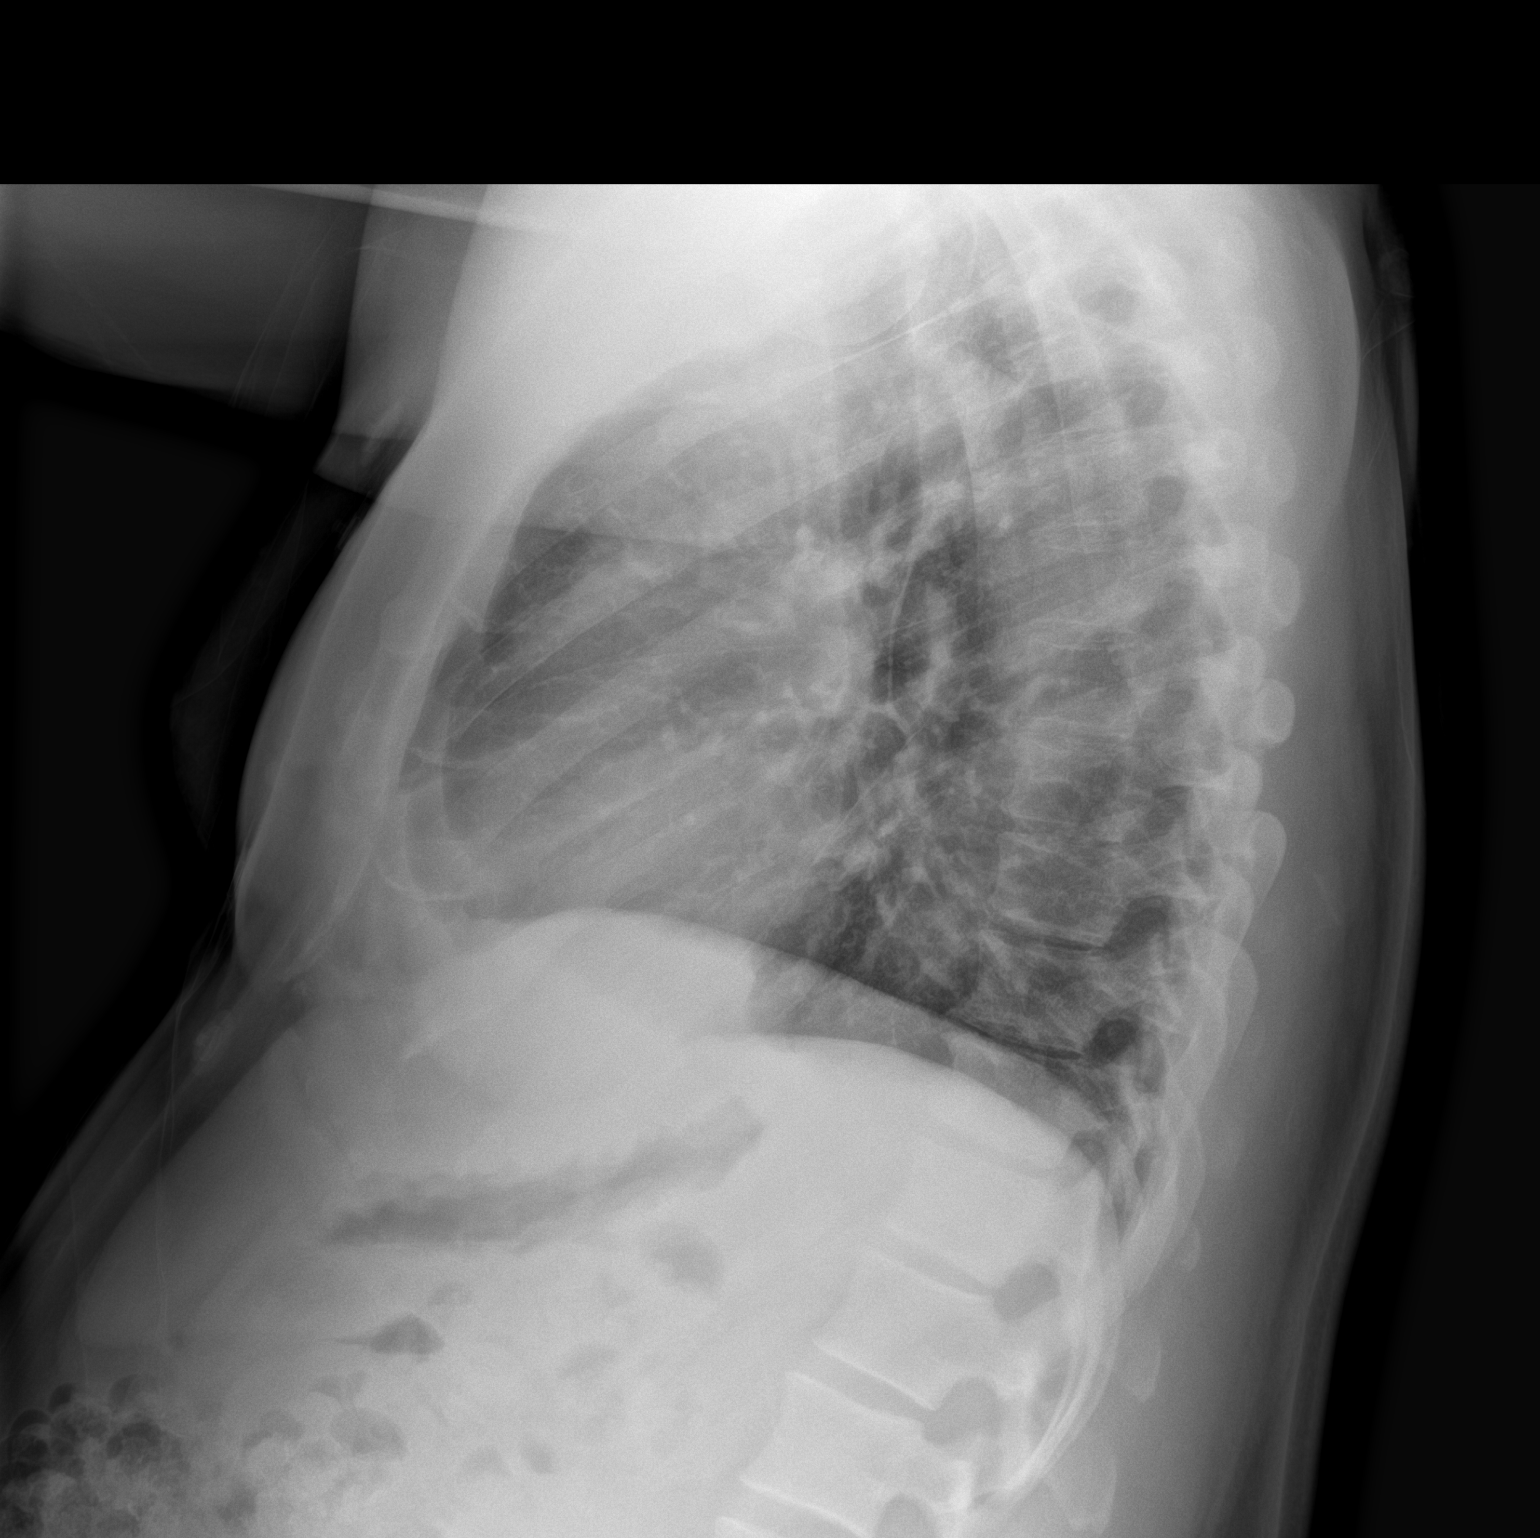

[2 of 2 positions shown; findings below may reference images not displayed]

FINDINGS: The heart size and mediastinal contours are within normal limits.
Both lungs are clear. The visualized skeletal structures are
unremarkable.
IMPRESSION: No active cardiopulmonary disease.

## 2023-11-13 ENCOUNTER — Emergency Department (HOSPITAL_BASED_OUTPATIENT_CLINIC_OR_DEPARTMENT_OTHER)
Admission: EM | Admit: 2023-11-13 | Discharge: 2023-11-13 | Disposition: A | Discharge status: Home / Self Care | Attending: Emergency Medicine | Admitting: Emergency Medicine

## 2023-11-13 ENCOUNTER — Emergency Department (HOSPITAL_BASED_OUTPATIENT_CLINIC_OR_DEPARTMENT_OTHER): Admitting: Radiology

## 2023-11-13 ENCOUNTER — Other Ambulatory Visit: Payer: Self-pay

## 2023-11-13 DIAGNOSIS — Z7984 Long term (current) use of oral hypoglycemic drugs: Secondary | ICD-10-CM | POA: Diagnosis not present

## 2023-11-13 DIAGNOSIS — I10 Essential (primary) hypertension: Secondary | ICD-10-CM | POA: Diagnosis not present

## 2023-11-13 DIAGNOSIS — R079 Chest pain, unspecified: Secondary | ICD-10-CM

## 2023-11-13 DIAGNOSIS — Z79899 Other long term (current) drug therapy: Secondary | ICD-10-CM | POA: Insufficient documentation

## 2023-11-13 DIAGNOSIS — E119 Type 2 diabetes mellitus without complications: Secondary | ICD-10-CM | POA: Insufficient documentation

## 2023-11-13 DIAGNOSIS — Z87891 Personal history of nicotine dependence: Secondary | ICD-10-CM | POA: Insufficient documentation

## 2023-11-13 LAB — CBC
HCT: 46 % (ref 39.0–52.0)
Hemoglobin: 14.7 g/dL (ref 13.0–17.0)
MCH: 27.2 pg (ref 26.0–34.0)
MCHC: 32 g/dL (ref 30.0–36.0)
MCV: 85.2 fL (ref 80.0–100.0)
Platelets: 232 10*3/uL (ref 150–400)
RBC: 5.4 MIL/uL (ref 4.22–5.81)
RDW: 13.9 % (ref 11.5–15.5)
WBC: 9.9 10*3/uL (ref 4.0–10.5)
nRBC: 0 % (ref 0.0–0.2)

## 2023-11-13 LAB — BASIC METABOLIC PANEL WITH GFR
Anion gap: 15 (ref 5–15)
BUN: 33 mg/dL — ABNORMAL HIGH (ref 6–20)
CO2: 21 mmol/L — ABNORMAL LOW (ref 22–32)
Calcium: 10.1 mg/dL (ref 8.9–10.3)
Chloride: 105 mmol/L (ref 98–111)
Creatinine, Ser: 2.03 mg/dL — ABNORMAL HIGH (ref 0.61–1.24)
GFR, Estimated: 39 mL/min — ABNORMAL LOW (ref >60)
Glucose, Bld: 122 mg/dL — ABNORMAL HIGH (ref 70–99)
Potassium: 4.1 mmol/L (ref 3.5–5.1)
Sodium: 141 mmol/L (ref 135–145)

## 2023-11-13 LAB — HEPATIC FUNCTION PANEL
ALT: 9 U/L (ref 0–44)
AST: 12 U/L — ABNORMAL LOW (ref 15–41)
Albumin: 4.2 g/dL (ref 3.5–5.0)
Alkaline Phosphatase: 96 U/L (ref 38–126)
Bilirubin, Direct: 0.2 mg/dL (ref 0.0–0.2)
Indirect Bilirubin: 0.3 mg/dL (ref 0.3–0.9)
Total Bilirubin: 0.5 mg/dL (ref 0.0–1.2)
Total Protein: 7.1 g/dL (ref 6.5–8.1)

## 2023-11-13 LAB — TROPONIN T, HIGH SENSITIVITY
Troponin T High Sensitivity: <15 ng/L (ref <19)
Troponin T High Sensitivity: <15 ng/L (ref <19)

## 2023-11-13 LAB — LIPASE, BLOOD: Lipase: 159 U/L — ABNORMAL HIGH (ref 11–51)

## 2023-11-13 LAB — D-DIMER, QUANTITATIVE: D-Dimer, Quant: <0.27 ug{FEU}/mL (ref 0.00–0.50)

## 2023-11-13 MED ORDER — OXYCODONE-ACETAMINOPHEN 5-325 MG PO TABS: 1.0000 | ORAL_TABLET | Freq: Four times a day (QID) | ORAL | 0 refills | Status: AC | PRN

## 2023-11-13 MED ORDER — PANTOPRAZOLE SODIUM 40 MG PO TBEC
40.0000 mg | DELAYED_RELEASE_TABLET | Freq: Every day | ORAL | Status: DC
  Administered 2023-11-13: 40 mg via ORAL
  Filled 2023-11-13: qty 1

## 2023-11-13 NOTE — Discharge Instructions (Signed)
 As we discussed, follow up with your doctor later this week for a recheck. Take Percocet for severe pain if needed. Recommend restarting Prilosec 20 mg twice daily until seen by PCP.   Return to the ED with any new or concerning symptoms.

## 2023-11-13 NOTE — ED Triage Notes (Signed)
 Left sided chest pain. Starting 1 week ago- intermittent.

## 2023-11-13 NOTE — ED Provider Notes (Signed)
 [place] [facility] AT [facility] Provider Note   CSN: [id number] Arrival date & time: 11/13/23  1505     History  Chief Complaint  Patient presents with   Chest Pain    Benjamin Branch is a 53 y.o. male.  Patient with a history of HLD, T2DM, HTN presents with sharp pain in the left chest that started one week ago. No injury. No cough or fever. The pain goes to the back at times. He denies increased pain with exertion. Reports he feels it more when he lies down. History of pancreatitis but states this is nothing like symptoms experienced with that. No N, V. He feels the pain when he takes a deep breath but denies SOB.   The history is provided by the patient. No language interpreter was used.  Chest Pain      Home Medications Prior to Admission medications   Medication Sig Start Date End Date Taking? Authorizing Provider  glipiZIDE  (GLUCOTROL ) 5 MG tablet Take 0.5 tablets (2.5 mg total) by mouth daily before breakfast. 05/04/18   Mozell Arias, MD  metFORMIN (GLUCOPHAGE) 1000 MG tablet Take 3 mg by mouth 2 (two) times daily with a meal.    [provider]  omeprazole  (PRILOSEC) 20 MG capsule Take 1 capsule (20 mg total) by mouth daily. 12/05/21   Almond Army, MD  ondansetron  (ZOFRAN -ODT) 4 MG disintegrating tablet Take 1 tablet (4 mg total) by mouth every 8 (eight) hours as needed for up to 12 doses for nausea or vomiting. 11/05/22   Charmayne Cooper, MD  oxyCODONE -acetaminophen  (PERCOCET/ROXICET) 5-325 MG tablet Take 1 tablet by mouth every 6 (six) hours as needed for severe pain (pain score 7-10). 11/13/23   Mandy Second, PA-C  sucralfate  (CARAFATE ) 1 g tablet Take 1 tablet (1 g total) by mouth 4 (four) times daily as needed. 06/29/18   Edson Graces, MD      Allergies    Penicillins    Review of Systems   Review of Systems  Cardiovascular:  Positive for chest pain.    Physical Exam Updated Vital Signs BP (!) 145/109   Pulse  71   Temp 98 F (36.7 C)   Resp 11   SpO2 99%  Physical Exam Vitals and nursing note reviewed.  Constitutional:      Appearance: He is well-developed.  Cardiovascular:     Rate and Rhythm: Normal rate and regular rhythm.     Heart sounds: Normal heart sounds.  Pulmonary:     Effort: Pulmonary effort is normal.     Breath sounds: Normal breath sounds.  Musculoskeletal:        General: Normal range of motion.     Cervical back: Normal range of motion and neck supple.     Right lower leg: No edema.     Left lower leg: No edema.  Skin:    General: Skin is warm and dry.  Neurological:     Mental Status: He is alert.     ED Results / Procedures / Treatments   Labs (all labs ordered are listed, but only abnormal results are displayed) Labs Reviewed  BASIC METABOLIC PANEL WITH GFR - Abnormal; Notable for the following components:      Result Value   CO2 21 (*)    Glucose, Bld 122 (*)    BUN 33 (*)    Creatinine, Ser 2.03 (*)    GFR, Estimated 39 (*)    All other components within  normal limits  LIPASE, BLOOD - Abnormal; Notable for the following components:   Lipase 159 (*)    All other components within normal limits  HEPATIC FUNCTION PANEL - Abnormal; Notable for the following components:   AST 12 (*)    All other components within normal limits  CBC  D-DIMER, QUANTITATIVE  TROPONIN T, HIGH SENSITIVITY  TROPONIN T, HIGH SENSITIVITY   Results for orders placed or performed during the hospital encounter of 11/13/23  Basic metabolic panel   Collection Time: 11/13/23  3:40 PM  Result Value Ref Range   Sodium 141 135 - 145 mmol/L   Potassium 4.1 3.5 - 5.1 mmol/L   Chloride 105 98 - 111 mmol/L   CO2 21 (L) 22 - 32 mmol/L   Glucose, Bld 122 (H) 70 - 99 mg/dL   BUN 33 (H) 6 - 20 mg/dL   Creatinine, Ser 1.61 (H) 0.61 - 1.24 mg/dL   Calcium 09.6 8.9 - 04.5 mg/dL   GFR, Estimated 39 (L) >60 mL/min   Anion gap 15 5 - 15  CBC   Collection Time: 11/13/23  3:40 PM  Result  Value Ref Range   WBC 9.9 4.0 - 10.5 K/uL   RBC 5.40 4.22 - 5.81 MIL/uL   Hemoglobin 14.7 13.0 - 17.0 g/dL   HCT 40.9 81.1 - 91.4 %   MCV 85.2 80.0 - 100.0 fL   MCH 27.2 26.0 - 34.0 pg   MCHC 32.0 30.0 - 36.0 g/dL   RDW 78.2 95.6 - 21.3 %   Platelets 232 150 - 400 K/uL   nRBC 0.0 0.0 - 0.2 %  Lipase, blood   Collection Time: 11/13/23  3:40 PM  Result Value Ref Range   Lipase 159 (H) 11 - 51 U/L  Troponin T, High Sensitivity   Collection Time: 11/13/23  3:40 PM  Result Value Ref Range   Troponin T High Sensitivity <15 <19 ng/L  Hepatic function panel   Collection Time: 11/13/23  9:02 PM  Result Value Ref Range   Total Protein 7.1 6.5 - 8.1 g/dL   Albumin 4.2 3.5 - 5.0 g/dL   AST 12 (L) 15 - 41 U/L   ALT 9 0 - 44 U/L   Alkaline Phosphatase 96 38 - 126 U/L   Total Bilirubin 0.5 0.0 - 1.2 mg/dL   Bilirubin, Direct 0.2 0.0 - 0.2 mg/dL   Indirect Bilirubin 0.3 0.3 - 0.9 mg/dL  D-dimer, quantitative   Collection Time: 11/13/23  9:02 PM  Result Value Ref Range   D-Dimer, Quant <0.27 0.00 - 0.50 ug/mL-FEU  Troponin T, High Sensitivity   Collection Time: 11/13/23  9:02 PM  Result Value Ref Range   Troponin T High Sensitivity <15 <19 ng/L    EKG EKG Interpretation Date/Time:  Monday Nov 13 2023 15:25:45 EDT Ventricular Rate:  89 PR Interval:  154 QRS Duration:  80 QT Interval:  342 QTC Calculation: 416 R Axis:   39  Text Interpretation: Normal sinus rhythm Normal ECG When compared with ECG of 05-Dec-2021 17:19, No significant change was found Confirmed by Rosealee Concha ([id number]) on 11/13/2023 7:52:28 PM  Radiology DG Chest 2 View Result Date: 11/13/2023 CLINICAL DATA:  Left-sided chest pain starting 1 week ago. EXAM: CHEST - 2 VIEW COMPARISON:  Chest two views FINDINGS: Cardiac silhouette and mediastinal contours are within normal limits. Mild atherosclerotic calcification within the aortic arch. The lungs are clear. No pleural effusion or pneumothorax. No acute skeletal  abnormality. IMPRESSION: No active  cardiopulmonary disease. Electronically Signed   By: Bertina Broccoli M.D.   On: 11/13/2023 17:21    Procedures Procedures    Medications Ordered in ED Medications  pantoprazole  (PROTONIX ) EC tablet 40 mg (has no administration in time range)    ED Course/ Medical Decision Making/ A&P                                 Medical Decision Making This patient presents to the ED for concern of chest pain, this involves an extensive number of treatment options, and is a complaint that carries with it a high risk of complications and morbidity.  The differential diagnosis includes ACS, PTX, PNA, dissection, MSK pain,    Co morbidities that complicate the patient evaluation  T2DM, HLD, HTN, pancreatitis   Additional history obtained:  Additional history and/or information obtained from chart review, notable for No recent or frequent ED visits   Lab Tests:  I Ordered, and personally interpreted labs.  The pertinent results include:  Glucose 122, BUN 33, Cr 2.03; no leukocytosis, normal hgb, lipase 159, troponin 15   Imaging Studies ordered:  I ordered imaging studies including CXR Per radiologist interpretation: IMPRESSION: No active cardiopulmonary disease.    Cardiac Monitoring:  The patient was maintained on a cardiac monitor.  I personally viewed and interpreted the cardiac monitored which showed an underlying rhythm of: Normal Sinus rhythm   Medicines ordered and prescription drug management:  I ordered medication including protonix   for pain Reevaluation of the patient after these medicines showed that the patient given at discharge I have reviewed the patients home medicines and have made adjustments as needed   Test Considered:  N/a   Critical Interventions:  N/a   Consultations Obtained:  I requested consultation with the n/a,  and discussed lab and imaging findings as well as pertinent plan - they recommend:  n/a   Problem List / ED Course:  Left chest pain x 1 week, sharp, worse with lying down or with deep breathing.  H/O pancreatitis, lipase 159 tonight. LUQ completely nontender to light or deep palpation No fever, vomiting.  D-dimer negative, no hypoxia, no tachycardia - doubt PE Normal EKG, troponin negative x 2 - doubt ACS He can be discharged home Follow up with primary care this week for recheck.    Reevaluation:  After the interventions noted above, I reevaluated the patient and found that they have :improved   Social Determinants of Health:  Former smoker    Disposition:  After consideration of the diagnostic results and the patients response to treatment, I feel that the patient would benefit from discharge home.   Amount and/or Complexity of Data Reviewed Labs: ordered. Radiology: ordered.  Risk Prescription drug management.           Final Clinical Impression(s) / ED Diagnoses Final diagnoses:  Nonspecific chest pain    Rx / DC Orders ED Discharge Orders          Ordered    oxyCODONE -acetaminophen  (PERCOCET/ROXICET) 5-325 MG tablet  Every 6 hours PRN        11/13/23 2156              Mandy Second, PA-C 11/13/23 2201    Rosealee Concha, MD 11/13/23 2241
